# Patient Record
Sex: Male | Born: 1980 | Race: White | Hispanic: No | Marital: Single | Smoking: Former smoker
Health system: Southern US, Community
[De-identification: ages and names within clinical notes are randomized; demographics above are authoritative.]

## PROBLEM LIST (undated history)

## (undated) DIAGNOSIS — E079 Disorder of thyroid, unspecified: Secondary | ICD-10-CM

## (undated) DIAGNOSIS — F32A Depression, unspecified: Secondary | ICD-10-CM

## (undated) DIAGNOSIS — F419 Anxiety disorder, unspecified: Secondary | ICD-10-CM

## (undated) DIAGNOSIS — F329 Major depressive disorder, single episode, unspecified: Secondary | ICD-10-CM

## (undated) DIAGNOSIS — R4586 Emotional lability: Secondary | ICD-10-CM

## (undated) DIAGNOSIS — K219 Gastro-esophageal reflux disease without esophagitis: Secondary | ICD-10-CM

## (undated) DIAGNOSIS — J45909 Unspecified asthma, uncomplicated: Secondary | ICD-10-CM

## (undated) DIAGNOSIS — F4310 Post-traumatic stress disorder, unspecified: Secondary | ICD-10-CM

## (undated) DIAGNOSIS — I2699 Other pulmonary embolism without acute cor pulmonale: Secondary | ICD-10-CM

## (undated) DIAGNOSIS — I10 Essential (primary) hypertension: Secondary | ICD-10-CM

## (undated) DIAGNOSIS — Z8739 Personal history of other diseases of the musculoskeletal system and connective tissue: Secondary | ICD-10-CM

## (undated) DIAGNOSIS — Z8719 Personal history of other diseases of the digestive system: Secondary | ICD-10-CM

## (undated) HISTORY — DX: Essential (primary) hypertension: I10

## (undated) HISTORY — PX: ESOPHAGOSCOPY WITH DILITATION: SHX5618

## (undated) HISTORY — DX: Personal history of other diseases of the digestive system: Z87.19

## (undated) HISTORY — DX: Emotional lability: R45.86

## (undated) HISTORY — PX: CERVICAL FUSION: SHX112

## (undated) HISTORY — DX: Personal history of other diseases of the musculoskeletal system and connective tissue: Z87.39

## (undated) HISTORY — PX: ANKLE SURGERY: SHX546

## (undated) HISTORY — DX: Post-traumatic stress disorder, unspecified: F43.10

## (undated) HISTORY — DX: Gastro-esophageal reflux disease without esophagitis: K21.9

## (undated) HISTORY — PX: ADENOIDECTOMY: SUR15

## (undated) HISTORY — PX: TONSILLECTOMY: SUR1361

## (undated) HISTORY — PX: HERNIA REPAIR: SHX51

---

## 2013-09-23 HISTORY — PX: ROTATOR CUFF REPAIR: SHX139

## 2018-02-21 DIAGNOSIS — I2699 Other pulmonary embolism without acute cor pulmonale: Secondary | ICD-10-CM

## 2018-02-21 HISTORY — DX: Other pulmonary embolism without acute cor pulmonale: I26.99

## 2018-06-08 ENCOUNTER — Emergency Department (HOSPITAL_COMMUNITY)
Admission: EM | Admit: 2018-06-08 | Discharge: 2018-06-09 | Disposition: A | Discharge status: Home / Self Care | Payer: Self-pay | Attending: Emergency Medicine | Admitting: Emergency Medicine

## 2018-06-08 ENCOUNTER — Encounter (HOSPITAL_COMMUNITY): Payer: Self-pay | Admitting: Emergency Medicine

## 2018-06-08 ENCOUNTER — Other Ambulatory Visit: Payer: Self-pay

## 2018-06-08 DIAGNOSIS — Z86711 Personal history of pulmonary embolism: Secondary | ICD-10-CM | POA: Insufficient documentation

## 2018-06-08 DIAGNOSIS — J45909 Unspecified asthma, uncomplicated: Secondary | ICD-10-CM | POA: Insufficient documentation

## 2018-06-08 DIAGNOSIS — Z87891 Personal history of nicotine dependence: Secondary | ICD-10-CM | POA: Insufficient documentation

## 2018-06-08 DIAGNOSIS — E039 Hypothyroidism, unspecified: Secondary | ICD-10-CM | POA: Insufficient documentation

## 2018-06-08 DIAGNOSIS — M5442 Lumbago with sciatica, left side: Secondary | ICD-10-CM | POA: Insufficient documentation

## 2018-06-08 DIAGNOSIS — G8929 Other chronic pain: Secondary | ICD-10-CM | POA: Insufficient documentation

## 2018-06-08 HISTORY — DX: Disorder of thyroid, unspecified: E07.9

## 2018-06-08 HISTORY — DX: Anxiety disorder, unspecified: F41.9

## 2018-06-08 HISTORY — DX: Other pulmonary embolism without acute cor pulmonale: I26.99

## 2018-06-08 HISTORY — DX: Depression, unspecified: F32.A

## 2018-06-08 HISTORY — DX: Major depressive disorder, single episode, unspecified: F32.9

## 2018-06-08 HISTORY — DX: Unspecified asthma, uncomplicated: J45.909

## 2018-06-08 MED ORDER — ONDANSETRON 8 MG PO TBDP
8.0000 mg | ORAL_TABLET | Freq: Once | ORAL | Status: AC
  Administered 2018-06-08: 8 mg via ORAL
  Filled 2018-06-08: qty 1

## 2018-06-08 MED ORDER — HYDROMORPHONE HCL 1 MG/ML IJ SOLN
1.0000 mg | Freq: Once | INTRAMUSCULAR | Status: AC
  Administered 2018-06-08: 1 mg via INTRAMUSCULAR
  Filled 2018-06-08: qty 1

## 2018-06-08 NOTE — ED Triage Notes (Signed)
Pt brought in by [facility] for c/o back pain that has been ongoing for past couple of days.

## 2018-06-08 NOTE — ED Provider Notes (Signed)
[facility] EMERGENCY DEPARTMENT Provider Note   CSN: [id number] Arrival date & time: 06/08/18  2146     History   Chief Complaint Chief Complaint  Patient presents with  . Back Pain    HPI Lee Jackson is a 37 y.o. male.  HPI  Lee Jackson is a 37 y.o. male with hx of chronic low back pain,  presents to the Emergency Department complaining of increasing pain to his lower back for 2-3 days.  Pain became worse after bending over to pick up something.  He describes the pain as constant to his midline lower back and radiates into the left thigh to level of his knee. He lives in [place] and currently here visiting.  He states he had MRI last December in [place], [place] that indicates he has a "bulging disc" he denies fever, chills, fall, numbness or weakness of the extremities, abd pain, urine or bowel changes.  Has been taking ibuprofen, but cannot no longer take it due to bleeding and he currently taking Eliquis due to previous PE.       Past Medical History:  Diagnosis Date  . Anxiety   . Asthma   . Depression   . Pulmonary embolism (HCC)   . Thyroid disease     There are no active problems to display for this patient.   Past Surgical History:  Procedure Laterality Date  . ANKLE SURGERY    . CERVICAL FUSION    . HERNIA REPAIR    . TONSILLECTOMY          Home Medications    Prior to Admission medications   Not on File    Family History No family history on file.  Social History Social History   Tobacco Use  . Smoking status: Former Games developer  . Smokeless tobacco: Never Used  Substance Use Topics  . Alcohol use: Not Currently  . Drug use: Not Currently     Allergies   Pickled meat; Latex; Mucinex [guaifenesin er]; Penicillins; Prevacid [lansoprazole]; Sudafed [pseudoephedrine hcl]; and Vancomycin   Review of Systems Review of Systems  Constitutional: Negative for fever.  Respiratory: Negative for shortness of breath.   Gastrointestinal: Negative for  abdominal pain, constipation and vomiting.  Genitourinary: Negative for decreased urine volume, difficulty urinating, dysuria, flank pain and hematuria.  Musculoskeletal: Positive for back pain. Negative for joint swelling.  Skin: Negative for rash.  Neurological: Negative for weakness and numbness.  All other systems reviewed and are negative.    Physical Exam Updated Vital Signs BP (!) 138/93 (BP Location: Right Arm)   Pulse 99   Temp 97.7 F (36.5 C) (Oral)   Resp 20   Ht 5\' 10"  (1.778 m)   Wt 113.4 kg   SpO2 96%   BMI 35.87 kg/m   Physical Exam  Constitutional: He is oriented to person, place, and time. He appears well-developed and well-nourished. No distress.  HENT:  Head: Normocephalic and atraumatic.  Neck: Normal range of motion. Neck supple.  Cardiovascular: Normal rate, regular rhythm and intact distal pulses.  DP pulses are strong and palpable bilaterally  Pulmonary/Chest: Effort normal and breath sounds normal. No respiratory distress.  Abdominal: Soft. He exhibits no distension. There is no tenderness.  Musculoskeletal: He exhibits tenderness. He exhibits no edema.       Lumbar back: He exhibits tenderness and pain. He exhibits normal range of motion, no swelling, no deformity, no laceration and normal pulse.  ttp of the lower lumbar spine and bilateral paraspinal  muscles, left greater than right.  Pt has 5/5 strength against resistance of bilateral lower extremities.  Hip flexors intact   Neurological: He is alert and oriented to person, place, and time. He has normal strength. No sensory deficit. He exhibits normal muscle tone. Coordination and gait normal.  Reflex Scores:      Patellar reflexes are 2+ on the right side and 2+ on the left side.      Achilles reflexes are 2+ on the right side and 2+ on the left side. Skin: Skin is warm and dry. Capillary refill takes less than 2 seconds. No rash noted.  Nursing note and vitals reviewed.    ED Treatments /  Results  Labs (all labs ordered are listed, but only abnormal results are displayed) Labs Reviewed - No data to display  EKG None  Radiology No results found.  Procedures Procedures (including critical care time)  Medications Ordered in ED Medications - No data to display   Initial Impression / Assessment and Plan / ED Course  I have reviewed the triage vital signs and the nursing notes.  Pertinent labs & imaging results that were available during my care of the patient were reviewed by me and considered in my medical decision making (see chart for details).     Review of pt's medical record shows MRI L spine from 12/18:   L4-5 moderate broad-based disc osteophyte eccentric left with small superimposed left paracentral disc extrusion extending inferiorly along the left posterior aspect of the L5 vertebral body results in mild left greater than right lateral recess narrowing with possible impingement of the descending left L5 nerve root. Mild bilateral neuroforaminal narrowing at L4-5.  Pt reviewed on narcotic database.  No recent rx's on file  Pt with likely acute on chronic back pain.  No concerning sx's for emergent neurological process. Pt agrees to arrange close f/u with MD when he returns home.  Return precautions discussed.     Final Clinical Impressions(s) / ED Diagnoses   Final diagnoses:  Chronic left-sided low back pain with left-sided sciatica    ED Discharge Orders    None       Pauline Ausriplett, Lorielle Boehning, PA-C 06/09/18 2234    Maia PlanLong, Joshua G, MD 06/10/18 [id number]

## 2018-06-09 MED ORDER — HYDROCODONE-ACETAMINOPHEN 5-325 MG PO TABS: ORAL_TABLET | ORAL | 0 refills | Status: DC

## 2018-06-09 MED ORDER — CYCLOBENZAPRINE HCL 10 MG PO TABS: 10.0000 mg | ORAL_TABLET | Freq: Three times a day (TID) | ORAL | 0 refills | Status: DC | PRN

## 2018-06-09 NOTE — Discharge Instructions (Addendum)
Apply ice packs on and off to your back.  Avoid twisting or bending movements.  Follow-up with your doctor when you return home.

## 2018-08-18 ENCOUNTER — Emergency Department (HOSPITAL_COMMUNITY): Payer: Self-pay

## 2018-08-18 ENCOUNTER — Emergency Department (HOSPITAL_COMMUNITY)
Admission: EM | Admit: 2018-08-18 | Discharge: 2018-08-18 | Disposition: A | Discharge status: Home / Self Care | Payer: Self-pay | Attending: Emergency Medicine | Admitting: Emergency Medicine

## 2018-08-18 ENCOUNTER — Encounter (HOSPITAL_COMMUNITY): Payer: Self-pay | Admitting: *Deleted

## 2018-08-18 ENCOUNTER — Other Ambulatory Visit: Payer: Self-pay

## 2018-08-18 DIAGNOSIS — J40 Bronchitis, not specified as acute or chronic: Secondary | ICD-10-CM | POA: Insufficient documentation

## 2018-08-18 DIAGNOSIS — J4541 Moderate persistent asthma with (acute) exacerbation: Secondary | ICD-10-CM | POA: Insufficient documentation

## 2018-08-18 DIAGNOSIS — Z87891 Personal history of nicotine dependence: Secondary | ICD-10-CM | POA: Insufficient documentation

## 2018-08-18 DIAGNOSIS — Z7901 Long term (current) use of anticoagulants: Secondary | ICD-10-CM | POA: Insufficient documentation

## 2018-08-18 DIAGNOSIS — J069 Acute upper respiratory infection, unspecified: Secondary | ICD-10-CM | POA: Insufficient documentation

## 2018-08-18 DIAGNOSIS — Z79899 Other long term (current) drug therapy: Secondary | ICD-10-CM | POA: Insufficient documentation

## 2018-08-18 LAB — CBC WITH DIFFERENTIAL/PLATELET
ABS IMMATURE GRANULOCYTES: 0.03 10*3/uL (ref 0.00–0.07)
BASOS ABS: 0.1 10*3/uL (ref 0.0–0.1)
Basophils Relative: 1 %
EOS ABS: 0.6 10*3/uL — AB (ref 0.0–0.5)
Eosinophils Relative: 5 %
HEMATOCRIT: 49.9 % (ref 39.0–52.0)
Hemoglobin: 15.7 g/dL (ref 13.0–17.0)
IMMATURE GRANULOCYTES: 0 %
LYMPHS ABS: 2.8 10*3/uL (ref 0.7–4.0)
Lymphocytes Relative: 27 %
MCH: 26.7 pg (ref 26.0–34.0)
MCHC: 31.5 g/dL (ref 30.0–36.0)
MCV: 85 fL (ref 80.0–100.0)
MONOS PCT: 6 %
Monocytes Absolute: 0.6 10*3/uL (ref 0.1–1.0)
NRBC: 0 % (ref 0.0–0.2)
Neutro Abs: 6.3 10*3/uL (ref 1.7–7.7)
Neutrophils Relative %: 61 %
PLATELETS: 223 10*3/uL (ref 150–400)
RBC: 5.87 MIL/uL — ABNORMAL HIGH (ref 4.22–5.81)
RDW: 13 % (ref 11.5–15.5)
WBC: 10.4 10*3/uL (ref 4.0–10.5)

## 2018-08-18 LAB — BASIC METABOLIC PANEL
ANION GAP: 9 (ref 5–15)
BUN: 12 mg/dL (ref 6–20)
CALCIUM: 9.1 mg/dL (ref 8.9–10.3)
CO2: 25 mmol/L (ref 22–32)
Chloride: 105 mmol/L (ref 98–111)
Creatinine, Ser: 1.1 mg/dL (ref 0.61–1.24)
GFR calc Af Amer: >60 mL/min (ref >60)
GFR calc non Af Amer: >60 mL/min (ref >60)
Glucose, Bld: 114 mg/dL — ABNORMAL HIGH (ref 70–99)
POTASSIUM: 3.8 mmol/L (ref 3.5–5.1)
Sodium: 139 mmol/L (ref 135–145)

## 2018-08-18 MED ORDER — ALBUTEROL (5 MG/ML) CONTINUOUS INHALATION SOLN
10.0000 mg/h | INHALATION_SOLUTION | Freq: Once | RESPIRATORY_TRACT | Status: AC
  Administered 2018-08-18: 10 mg/h via RESPIRATORY_TRACT
  Filled 2018-08-18: qty 20

## 2018-08-18 MED ORDER — IPRATROPIUM BROMIDE 0.02 % IN SOLN
0.5000 mg | Freq: Once | RESPIRATORY_TRACT | Status: AC
  Administered 2018-08-18: 0.5 mg via RESPIRATORY_TRACT
  Filled 2018-08-18: qty 2.5

## 2018-08-18 MED ORDER — BENZONATATE 100 MG PO CAPS: 200.0000 mg | ORAL_CAPSULE | Freq: Two times a day (BID) | ORAL | 0 refills | Status: DC | PRN

## 2018-08-18 MED ORDER — ALBUTEROL SULFATE HFA 108 (90 BASE) MCG/ACT IN AERS: 1.0000 | INHALATION_SPRAY | RESPIRATORY_TRACT | 0 refills | Status: DC | PRN

## 2018-08-18 MED ORDER — PREDNISONE 10 MG PO TABS: 50.0000 mg | ORAL_TABLET | Freq: Every day | ORAL | 0 refills | Status: DC

## 2018-08-18 MED ORDER — PREDNISONE 50 MG PO TABS
60.0000 mg | ORAL_TABLET | Freq: Once | ORAL | Status: AC
  Administered 2018-08-18: 60 mg via ORAL
  Filled 2018-08-18: qty 1

## 2018-08-18 MED ORDER — BENZONATATE 100 MG PO CAPS
200.0000 mg | ORAL_CAPSULE | Freq: Once | ORAL | Status: AC
  Administered 2018-08-18: 200 mg via ORAL
  Filled 2018-08-18: qty 2

## 2018-08-18 NOTE — ED Triage Notes (Signed)
Pt brought in by [facility] for c/o coughing up green and white sputum; pt has been coughing x one month; pt c/o right side chest pain

## 2018-08-18 NOTE — Discharge Instructions (Signed)
We saw you in the ER for your breathing related complains. We gave you some breathing treatments in the ER, and seems like your symptoms have improved. °Please take albuterol as needed every 4 hours. °Please take the medications prescribed. °Please refrain from smoking or smoke exposure. °Please see a primary care doctor in 1 week. °Return to the ER if your symptoms worsen. ° °

## 2018-08-18 NOTE — ED Provider Notes (Signed)
[facility] EMERGENCY DEPARTMENT Provider Note   CSN: [id number] Arrival date & time: 08/18/18  4098     History   Chief Complaint Chief Complaint  Patient presents with  . Cough    HPI Lee Jackson is a 37 y.o. male.  HPI  37 year old male comes in with chief complaint of cough.  Patient has history of PE and pneumonia which were diagnosed about 6 months ago.  Patient is on Eliquis for his PE.  Patient states that over the past 2 or 3 weeks he has had cough and URI-like symptoms.  Today with his cough he produced thick, dark phlegm.  Patient denies any associated fevers, chills.  Patient does have mild shortness of breath and wheezing with his cough.  He has history of asthma.  Patient denies any chest pain.  Past Medical History:  Diagnosis Date  . Anxiety   . Asthma   . Depression   . Pulmonary embolism (HCC)   . Thyroid disease     There are no active problems to display for this patient.   Past Surgical History:  Procedure Laterality Date  . ANKLE SURGERY    . CERVICAL FUSION    . HERNIA REPAIR    . TONSILLECTOMY          Home Medications    Prior to Admission medications   Medication Sig Start Date End Date Taking? Authorizing Provider  amitriptyline (ELAVIL) 75 MG tablet Take 75 mg by mouth at bedtime.    [provider]  apixaban (ELIQUIS) 5 MG TABS tablet Take 5 mg by mouth 2 (two) times daily.    [provider]  cyclobenzaprine (FLEXERIL) 10 MG tablet Take 1 tablet (10 mg total) by mouth 3 (three) times daily as needed. 06/09/18   Triplett, Tammy, PA-C  DULoxetine (CYMBALTA) 30 MG capsule Take 30 mg by mouth 2 (two) times daily.    [provider]  fluticasone (FLOVENT HFA) 44 MCG/ACT inhaler Inhale 2 puffs into the lungs daily as needed (for shortness of breath).     [provider]  HYDROcodone-acetaminophen (NORCO/VICODIN) 5-325 MG tablet Take one tab po q 4 hrs prn pain 06/09/18   Triplett, Tammy, PA-C    levothyroxine (SYNTHROID, LEVOTHROID) 150 MCG tablet Take 150 mcg by mouth daily before breakfast.    [provider]    Family History History reviewed. No pertinent family history.  Social History Social History   Tobacco Use  . Smoking status: Former Games developer  . Smokeless tobacco: Never Used  Substance Use Topics  . Alcohol use: Not Currently  . Drug use: Not Currently     Allergies   Pickled meat; Latex; Mucinex [guaifenesin er]; Penicillins; Prevacid [lansoprazole]; Sudafed [pseudoephedrine hcl]; and Vancomycin   Review of Systems Review of Systems  Constitutional: Positive for activity change.  Respiratory: Positive for cough and shortness of breath.   Allergic/Immunologic: Negative for immunocompromised state.  Hematological: Bruises/bleeds easily.  All other systems reviewed and are negative.    Physical Exam Updated Vital Signs BP 140/86   Pulse (!) 102   Temp 98.4 F (36.9 C) (Oral)   Resp 20   Ht 5\' 11"  (1.803 m)   Wt 111.1 kg   SpO2 100%   BMI 34.17 kg/m   Physical Exam  Constitutional: He is oriented to person, place, and time. He appears well-developed.  HENT:  Head: Atraumatic.  Neck: Neck supple.  Cardiovascular: Normal rate.  Pulmonary/Chest: Effort normal. He has wheezes.  Musculoskeletal:  He exhibits no edema or tenderness.  Neurological: He is alert and oriented to person, place, and time.  Skin: Skin is warm.  Nursing note and vitals reviewed.    ED Treatments / Results  Labs (all labs ordered are listed, but only abnormal results are displayed) Labs Reviewed  CBC WITH DIFFERENTIAL/PLATELET - Abnormal; Notable for the following components:      Result Value   RBC 5.87 (*)    Eosinophils Absolute 0.6 (*)    All other components within normal limits  BASIC METABOLIC PANEL    EKG None  Radiology Dg Chest 2 View  Result Date: 08/18/2018 CLINICAL DATA:  Cough EXAM: CHEST - 2 VIEW COMPARISON:  None. FINDINGS: The  heart size and mediastinal contours are within normal limits. Both lungs are clear. The visualized skeletal structures are unremarkable. IMPRESSION: No active cardiopulmonary disease. Electronically Signed   By: Deatra RobinsonKevin  Herman M.D.   On: 08/18/2018 04:52    Procedures Procedures (including critical care time)  Medications Ordered in ED Medications  benzonatate (TESSALON) capsule 200 mg (200 mg Oral Given 08/18/18 0552)  albuterol (PROVENTIL,VENTOLIN) solution continuous neb (10 mg/hr Nebulization Given 08/18/18 0555)  ipratropium (ATROVENT) nebulizer solution 0.5 mg (0.5 mg Nebulization Given 08/18/18 0555)     Initial Impression / Assessment and Plan / ED Course  I have reviewed the triage vital signs and the nursing notes.  Pertinent labs & imaging results that were available during my care of the patient were reviewed by me and considered in my medical decision making (see chart for details).  Clinical Course as of Aug 18 704  Tue Aug 18, 2018  0703 White count appears to be fairly normal.  Patient is getting nebulizer treatment and he was reassessed.  His wheezing has improved significantly.  He still having mild end expiratory wheezing.  I discussed with the patient that I think most likely he is having bronchitis with asthma exacerbation.  I also discussed with him that the blood-tinged cough is because of his bronchitis, and that he needs to continue taking the Eliquis. Plan is to discharge him with prednisone.  He will follow-up with his PCP next week.  WBC: 10.4 [AN]    Clinical Course User Index [AN] Derwood KaplanNanavati, Reizel Calzada, MD    [age] year old male comes in with chief complaint of cough.  He has history of PE for which he is taking Eliquis.  Patient has been having some URI-like symptoms for the past few days,, however last night he started having cough with what appears to be blood-tinged, thick and green phlegm.  He is not having frank hemoptysis, and he is not spiting out frank  blood either.  I do not think he is having large pulmonary hemorrhage, and we will not advise him to discontinue Eliquis at this time.  On exam he has generalized wheezing, therefore I think there is underlying asthma exacerbation versus bronchitis.  Chest x-ray does not show any evidence of pneumonia, clinical suspicion for pneumonia is also low given the persistence of symptoms for the last several days and no fevers.  Plan is to give him nebulizer treatment and reassess.  Final Clinical Impressions(s) / ED Diagnoses   Final diagnoses:  Bronchitis  Acute upper respiratory infection  Moderate persistent asthma with acute exacerbation    ED Discharge Orders    None       Derwood KaplanNanavati, Lavaeh Bau, MD 08/18/18 [phone]

## 2018-10-22 ENCOUNTER — Encounter (HOSPITAL_COMMUNITY): Payer: Self-pay | Admitting: Emergency Medicine

## 2018-10-22 ENCOUNTER — Emergency Department (HOSPITAL_COMMUNITY): Payer: Self-pay

## 2018-10-22 ENCOUNTER — Emergency Department (HOSPITAL_COMMUNITY)
Admission: EM | Admit: 2018-10-22 | Discharge: 2018-10-22 | Disposition: A | Discharge status: Home / Self Care | Payer: Self-pay | Attending: Emergency Medicine | Admitting: Emergency Medicine

## 2018-10-22 ENCOUNTER — Other Ambulatory Visit: Payer: Self-pay

## 2018-10-22 DIAGNOSIS — L97909 Non-pressure chronic ulcer of unspecified part of unspecified lower leg with unspecified severity: Secondary | ICD-10-CM | POA: Insufficient documentation

## 2018-10-22 DIAGNOSIS — Z79899 Other long term (current) drug therapy: Secondary | ICD-10-CM | POA: Insufficient documentation

## 2018-10-22 DIAGNOSIS — Z91040 Latex allergy status: Secondary | ICD-10-CM | POA: Insufficient documentation

## 2018-10-22 DIAGNOSIS — J45909 Unspecified asthma, uncomplicated: Secondary | ICD-10-CM | POA: Insufficient documentation

## 2018-10-22 DIAGNOSIS — E039 Hypothyroidism, unspecified: Secondary | ICD-10-CM | POA: Insufficient documentation

## 2018-10-22 DIAGNOSIS — Z7901 Long term (current) use of anticoagulants: Secondary | ICD-10-CM | POA: Insufficient documentation

## 2018-10-22 DIAGNOSIS — L03115 Cellulitis of right lower limb: Secondary | ICD-10-CM | POA: Insufficient documentation

## 2018-10-22 LAB — COMPREHENSIVE METABOLIC PANEL
ALT: 21 U/L (ref 0–44)
AST: 14 U/L — AB (ref 15–41)
Albumin: 4.1 g/dL (ref 3.5–5.0)
Alkaline Phosphatase: 48 U/L (ref 38–126)
Anion gap: 7 (ref 5–15)
BUN: 12 mg/dL (ref 6–20)
CHLORIDE: 105 mmol/L (ref 98–111)
CO2: 26 mmol/L (ref 22–32)
CREATININE: 1.14 mg/dL (ref 0.61–1.24)
Calcium: 8.7 mg/dL — ABNORMAL LOW (ref 8.9–10.3)
GFR calc Af Amer: >60 mL/min (ref >60)
GFR calc non Af Amer: >60 mL/min (ref >60)
Glucose, Bld: 96 mg/dL (ref 70–99)
Potassium: 4.1 mmol/L (ref 3.5–5.1)
SODIUM: 138 mmol/L (ref 135–145)
Total Bilirubin: 0.4 mg/dL (ref 0.3–1.2)
Total Protein: 7.1 g/dL (ref 6.5–8.1)

## 2018-10-22 LAB — CBC WITH DIFFERENTIAL/PLATELET
Abs Immature Granulocytes: 0.04 10*3/uL (ref 0.00–0.07)
Basophils Absolute: 0.1 10*3/uL (ref 0.0–0.1)
Basophils Relative: 1 %
EOS PCT: 4 %
Eosinophils Absolute: 0.4 10*3/uL (ref 0.0–0.5)
HEMATOCRIT: 49.5 % (ref 39.0–52.0)
HEMOGLOBIN: 15.5 g/dL (ref 13.0–17.0)
Immature Granulocytes: 1 %
LYMPHS ABS: 2.5 10*3/uL (ref 0.7–4.0)
LYMPHS PCT: 29 %
MCH: 26.7 pg (ref 26.0–34.0)
MCHC: 31.3 g/dL (ref 30.0–36.0)
MCV: 85.3 fL (ref 80.0–100.0)
MONO ABS: 0.6 10*3/uL (ref 0.1–1.0)
Monocytes Relative: 7 %
Neutro Abs: 5.2 10*3/uL (ref 1.7–7.7)
Neutrophils Relative %: 58 %
Platelets: 245 10*3/uL (ref 150–400)
RBC: 5.8 MIL/uL (ref 4.22–5.81)
RDW: 12.7 % (ref 11.5–15.5)
WBC: 8.9 10*3/uL (ref 4.0–10.5)
nRBC: 0 % (ref 0.0–0.2)

## 2018-10-22 LAB — TSH: TSH: 13.333 u[IU]/mL — ABNORMAL HIGH (ref 0.350–4.500)

## 2018-10-22 LAB — T4, FREE: Free T4: 0.71 ng/dL — ABNORMAL LOW (ref 0.82–1.77)

## 2018-10-22 MED ORDER — DOXYCYCLINE HYCLATE 100 MG PO CAPS: 100.0000 mg | ORAL_CAPSULE | Freq: Two times a day (BID) | ORAL | 0 refills | Status: DC

## 2018-10-22 NOTE — ED Triage Notes (Signed)
Pt has recurrent leg ulcers,  Over the last month they have progressed. Pt has been admitted in the past, testing done and no real answers to the condition or treatment.

## 2018-10-22 NOTE — Discharge Instructions (Signed)
Follow-up with the primary care doctor to help manage your thyroid and further manage the ulcer/infection.

## 2018-10-22 NOTE — ED Provider Notes (Signed)
[facility] EMERGENCY DEPARTMENT Provider Note   CSN: [id number] Arrival date & time: 10/22/18  1696     History   Chief Complaint Chief Complaint  Patient presents with  . leg ulcers    HPI Lee Jackson is a 38 y.o. male.  HPI Patient presents with leg ulcers.  Has had previous admissions for it.  Had been on both of his lower legs but now primarily on right side.  States he has had to have IV antibiotics after ulcer not work.  States he has had biopsies but only showed some inflammation.  Also has had previous pulmonary embolism.  Still on anticoagulation.  Also history of hypothyroidism.  No fevers.  Pain on the right side.  No drainage.  Has not had fevers or chills.  He has previously had issues with his thyroid.  Currently on 150 mcg.  Had been on that previously then dosage increased to 75 when his TSH improved.  He had to increase back to 150 around 3 months ago. Past Medical History:  Diagnosis Date  . Anxiety   . Asthma   . Depression   . Pulmonary embolism (HCC)   . Thyroid disease     There are no active problems to display for this patient.   Past Surgical History:  Procedure Laterality Date  . ANKLE SURGERY    . CERVICAL FUSION    . HERNIA REPAIR    . TONSILLECTOMY          Home Medications    Prior to Admission medications   Medication Sig Start Date End Date Taking? Authorizing Provider  albuterol (ACCUNEB) 0.63 MG/3ML nebulizer solution Take 1 ampule by nebulization every 6 (six) hours as needed for wheezing.   Yes [provider]  albuterol (PROVENTIL HFA;VENTOLIN HFA) 108 (90 Base) MCG/ACT inhaler Inhale 1-2 puffs into the lungs every 4 (four) hours as needed for wheezing or shortness of breath. 08/18/18  Yes [name], MD  amitriptyline (ELAVIL) 75 MG tablet Take 75 mg by mouth at bedtime.   Yes [provider]  apixaban (ELIQUIS) 5 MG TABS tablet Take 5 mg by mouth 2 (two) times daily.   Yes [provider]    cyclobenzaprine (FLEXERIL) 10 MG tablet Take 1 tablet (10 mg total) by mouth 3 (three) times daily as needed. 06/09/18  Yes Triplett, Tammy, PA-C  DULoxetine (CYMBALTA) 30 MG capsule Take 30 mg by mouth 2 (two) times daily.   Yes [provider]  levothyroxine (SYNTHROID, LEVOTHROID) 150 MCG tablet Take 150 mcg by mouth daily before breakfast.   Yes [provider]    Family History No family history on file.  Social History Social History   Tobacco Use  . Smoking status: Former Games developer  . Smokeless tobacco: Never Used  Substance Use Topics  . Alcohol use: Not Currently  . Drug use: Not Currently     Allergies   Banana; Pickled meat; Latex; Mucinex [guaifenesin er]; Penicillins; Prevacid [lansoprazole]; Sudafed [pseudoephedrine hcl]; and Vancomycin   Review of Systems Review of Systems  Constitutional: Negative for appetite change, fatigue and fever.  Respiratory: Negative for shortness of breath.   Cardiovascular: Negative for chest pain.  Gastrointestinal: Negative for abdominal pain.  Genitourinary: Negative for flank pain.  Musculoskeletal: Negative for back pain.  Skin: Positive for wound.  Neurological: Negative for weakness and light-headedness.  Hematological: Negative for adenopathy.  Psychiatric/Behavioral: Negative for confusion.     Physical Exam Updated Vital Signs BP (!) 141/87 (  BP Location: Left Arm)   Pulse 83   Temp 98 F (36.7 C) (Oral)   Resp 18   Ht 5\' 10"  (1.778 m)   Wt 111.1 kg   SpO2 97%   BMI 35.15 kg/m   Physical Exam Constitutional:      Appearance: Normal appearance.  HENT:     Head: Atraumatic.  Eyes:     Pupils: Pupils are equal, round, and reactive to light.  Neck:     Musculoskeletal: Neck supple.  Cardiovascular:     Rate and Rhythm: Regular rhythm.  Pulmonary:     Effort: Pulmonary effort is normal.     Breath sounds: No wheezing or rhonchi.  Abdominal:     Tenderness: There is no abdominal  tenderness.  Musculoskeletal:        General: Tenderness present.     Comments: Some mild edema bilateral lower extremities.  On right side has erythema with several different ulcers.  Some have some granulation tissue some are just flat based.  No drainage.  No purulence.  Pulse intact bilateral feet.  Skin:    General: Skin is warm.     Capillary Refill: Capillary refill takes less than 2 seconds.  Neurological:     General: No focal deficit present.     Mental Status: He is alert.      ED Treatments / Results  Labs (all labs ordered are listed, but only abnormal results are displayed) Labs Reviewed  TSH - Abnormal; Notable for the following components:      Result Value   TSH 13.333 (*)    All other components within normal limits  COMPREHENSIVE METABOLIC PANEL - Abnormal; Notable for the following components:   Calcium 8.7 (*)    AST 14 (*)    All other components within normal limits  CBC WITH DIFFERENTIAL/PLATELET  T4, FREE  T3, FREE    EKG None  Radiology [place] Venous Img Lower Unilateral Right  Result Date: 10/22/2018 CLINICAL DATA:  Right lower extremity edema and lower extremity ulcer. History of pulmonary embolism. EXAM: RIGHT LOWER EXTREMITY VENOUS DOPPLER ULTRASOUND TECHNIQUE: Gray-scale sonography with graded compression, as well as color Doppler and duplex ultrasound were performed to evaluate the lower extremity deep venous systems from the level of the common femoral vein and including the common femoral, femoral, profunda femoral, popliteal and calf veins including the posterior tibial, peroneal and gastrocnemius veins when visible. The superficial great saphenous vein was also interrogated. Spectral Doppler was utilized to evaluate flow at rest and with distal augmentation maneuvers in the common femoral, femoral and popliteal veins. COMPARISON:  None. FINDINGS: Contralateral Common Femoral Vein: Respiratory phasicity is normal and symmetric with the symptomatic  side. No evidence of thrombus. Normal compressibility. Common Femoral Vein: No evidence of thrombus. Normal compressibility, respiratory phasicity and response to augmentation. Saphenofemoral Junction: No evidence of thrombus. Normal compressibility and flow on color Doppler imaging. Profunda Femoral Vein: No evidence of thrombus. Normal compressibility and flow on color Doppler imaging. Femoral Vein: No evidence of thrombus. Normal compressibility, respiratory phasicity and response to augmentation. Popliteal Vein: No evidence of thrombus. Normal compressibility, respiratory phasicity and response to augmentation. Calf Veins: No evidence of thrombus. Normal compressibility and flow on color Doppler imaging. Superficial Great Saphenous Vein: No evidence of thrombus. Normal compressibility. Venous Reflux:  None. Other Findings: No evidence of superficial thrombophlebitis or abnormal fluid collection. IMPRESSION: No evidence of right lower extremity deep venous thrombosis. Electronically Signed   By: Irish LackGlenn  Yamagata  M.D.   On: 10/22/2018 10:50    Procedures Procedures (including critical care time)  Medications Ordered in ED Medications - No data to display   Initial Impression / Assessment and Plan / ED Course  I have reviewed the triage vital signs and the nursing notes.  Pertinent labs & imaging results that were available during my care of the patient were reviewed by me and considered in my medical decision making (see chart for details).     Patient with recurrent ulcers of lower extremity.  Scars and hyperpigmentation on left lower leg from previous wounds.  Will treat as infection now.  Negative Doppler.  Lab work reassuring but TSH is elevated.  T3 and T4 sent.  Will increase levothyroxine.  Will have follow-up with outpatient and care manager will help find PCP for patient, who does not have a  Final Clinical Impressions(s) / ED Diagnoses   Final diagnoses:  None    ED Discharge  Orders    None       Benjiman Core, MD 10/22/18 1346

## 2018-10-22 NOTE — Clinical Social Work Note (Signed)
CSW met with pt per Dr request to address issue of no insurance, no PCP.  Pt moved here towards the end of summer of 19 with girlfriend from [place], [place].  He has no income, has applied for disability, is currently awaiting news about the status of the appeal to his denial.  His financial support is his girlfriend, who gets disability as her only income. Offered him a referral to [facility] for PCP and nurse case management, for which he was grateful.  Given an appointment on Tuesday at 11:00.

## 2018-10-23 LAB — T3, FREE: T3 FREE: 3.7 pg/mL (ref 2.0–4.4)

## 2018-10-30 DIAGNOSIS — Z139 Encounter for screening, unspecified: Secondary | ICD-10-CM

## 2018-10-30 LAB — GLUCOSE, POCT (MANUAL RESULT ENTRY): POC Glucose: 100 mg/dl — AB (ref 70–99)
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
## 2018-11-09 ENCOUNTER — Ambulatory Visit: Payer: Medicaid Other | Admitting: Physician Assistant

## 2018-11-09 ENCOUNTER — Encounter: Payer: Self-pay | Admitting: Physician Assistant

## 2018-11-09 VITALS — BP 133/81 | HR 89 | Temp 98.1°F | Ht 70.0 in | Wt 260.0 lb

## 2018-11-09 DIAGNOSIS — F39 Unspecified mood [affective] disorder: Secondary | ICD-10-CM

## 2018-11-09 DIAGNOSIS — Z13220 Encounter for screening for lipoid disorders: Secondary | ICD-10-CM

## 2018-11-09 DIAGNOSIS — Z86711 Personal history of pulmonary embolism: Secondary | ICD-10-CM

## 2018-11-09 DIAGNOSIS — Z7901 Long term (current) use of anticoagulants: Secondary | ICD-10-CM

## 2018-11-09 DIAGNOSIS — E785 Hyperlipidemia, unspecified: Secondary | ICD-10-CM

## 2018-11-09 DIAGNOSIS — L97919 Non-pressure chronic ulcer of unspecified part of right lower leg with unspecified severity: Secondary | ICD-10-CM

## 2018-11-09 DIAGNOSIS — E039 Hypothyroidism, unspecified: Secondary | ICD-10-CM

## 2018-11-09 DIAGNOSIS — Z7689 Persons encountering health services in other specified circumstances: Secondary | ICD-10-CM

## 2018-11-09 DIAGNOSIS — Z131 Encounter for screening for diabetes mellitus: Secondary | ICD-10-CM

## 2018-11-09 MED ORDER — LEVOTHYROXINE SODIUM 150 MCG PO TABS: 150.0000 ug | ORAL_TABLET | Freq: Every day | ORAL | 1 refills | Status: AC

## 2018-11-09 NOTE — Patient Instructions (Signed)
Financial Counselor- [phone]  

## 2018-11-09 NOTE — Progress Notes (Signed)
BP 133/81 (BP Location: Left Arm, Patient Position: Sitting, Cuff Size: Normal)   Pulse 89   Temp 98.1 F (36.7 C)   Ht 5\' 10"  (1.778 m)   Wt 260 lb (117.9 kg)   SpO2 94%   BMI 37.31 kg/m    Subjective:    Patient ID: Lee Jackson, male    DOB: 15-Dec-1980, 38 y.o.   MRN: [id number]  HPI: Nehemias Knaff is a 38 y.o. male presenting on 11/09/2018 for New Patient (Initial Visit) (previous patient at [facility] in [place], [place]. pt states he recently monved here 08/2018) and Sore (bilateral lower legs for 2 years. pt states he has been hospitalized for it in the past. pt states ichy, burning, painful. R worse then L)   HPI   Chief Complaint  Patient presents with  . New Patient (Initial Visit)    previous patient at [facility] in [place], [place]. pt states he recently monved here 08/2018  . Sore    bilateral lower legs for 2 years. pt states he has been hospitalized for it in the past. pt states ichy, burning, painful. R worse then L    Pt previously seen at [facility] but was discharged due to chronic noncompliance (per Epic review)  Hx PE on anticoagulation (1st PE was in April 2019 after HCAP and LE cellulitis) , hypothyroidism, LE ulcers which have been biopsied and shown inflammation, chronic LBP,  s/p neck surgery,  adjustment disorder, anxiety disorder, depression, SI.    Pt reports that he was admitted to [facility] in [place] in the past- that is where he had biopsies done of his LE ulcers.   Pt says he was told the PEs were due to LE blood clot even though US showed none.    Records of this are not available for review at this time.   Pt states that LE ulcers have been present for 2 years.  He says they wax and wane but never resolve.  He denies picking at them.   Pt has not been to [facility] yet even though he was told about it.  He saw a psychiatrist here last week for disability evaluation.   He has significant hx mental disorders.   Relevant  past medical, surgical, family and social history reviewed and updated as indicated. Interim medical history since our last visit reviewed. Allergies and medications reviewed and updated.    Current Outpatient Medications:  .  albuterol (ACCUNEB) 0.63 MG/3ML nebulizer solution, Take 1 ampule by nebulization every 6 (six) hours as needed for wheezing., Disp: , Rfl:  .  albuterol (PROVENTIL HFA;VENTOLIN HFA) 108 (90 Base) MCG/ACT inhaler, Inhale 1-2 puffs into the lungs every 4 (four) hours as needed for wheezing or shortness of breath., Disp: 1 Inhaler, Rfl: 0 .  amitriptyline (ELAVIL) 75 MG tablet, Take 75 mg by mouth at bedtime., Disp: , Rfl:  .  apixaban (ELIQUIS) 5 MG TABS tablet, Take 5 mg by mouth 2 (two) times daily., Disp: , Rfl:  .  citalopram (CELEXA) 20 MG tablet, Take 20 mg by mouth daily., Disp: , Rfl:  .  cyclobenzaprine (FLEXERIL) 10 MG tablet, Take 1 tablet (10 mg total) by mouth 3 (three) times daily as needed., Disp: 21 tablet, Rfl: 0 .  DULoxetine (CYMBALTA) 30 MG capsule, Take 30 mg by mouth 2 (two) times daily., Disp: , Rfl:  .  ibuprofen (ADVIL,MOTRIN) 600 MG tablet, Take 600 mg by mouth every 6 (six) hours as  needed., Disp: , Rfl:  .  levothyroxine (SYNTHROID, LEVOTHROID) 125 MCG tablet, Take 125 mcg by mouth daily before breakfast., Disp: , Rfl:     Review of Systems  Constitutional: Positive for appetite change and fatigue. Negative for chills, diaphoresis, fever and unexpected weight change.  HENT: Negative for congestion, dental problem, drooling, ear pain, facial swelling, hearing loss, mouth sores, sneezing, sore throat, trouble swallowing and voice change.   Eyes: Positive for pain, itching and visual disturbance. Negative for discharge and redness.  Respiratory: Positive for cough, shortness of breath and wheezing. Negative for choking.   Cardiovascular: Positive for leg swelling. Negative for chest pain and palpitations.  Gastrointestinal: Negative for abdominal  pain, blood in stool, constipation, diarrhea and vomiting.  Endocrine: Negative for cold intolerance, heat intolerance and polydipsia.  Genitourinary: Negative for decreased urine volume, dysuria and hematuria.  Musculoskeletal: Positive for arthralgias, back pain and gait problem.  Skin: Positive for rash.  Allergic/Immunologic: Negative for environmental allergies.  Neurological: Positive for headaches. Negative for seizures, syncope and light-headedness.  Hematological: Negative for adenopathy.  Psychiatric/Behavioral: Positive for dysphoric mood. Negative for agitation and suicidal ideas. The patient is nervous/anxious.     Per HPI unless specifically indicated above     Objective:    BP 133/81 (BP Location: Left Arm, Patient Position: Sitting, Cuff Size: Normal)   Pulse 89   Temp 98.1 F (36.7 C)   Ht 5\' 10"  (1.778 m)   Wt 260 lb (117.9 kg)   SpO2 94%   BMI 37.31 kg/m   Wt Readings from Last 3 Encounters:  11/09/18 260 lb (117.9 kg)  10/30/18 245 lb (111.1 kg)  10/22/18 245 lb (111.1 kg)    Physical Exam Vitals signs reviewed.  Constitutional:      Appearance: He is well-developed.  HENT:     Head: Normocephalic and atraumatic.     Mouth/Throat:     Pharynx: No oropharyngeal exudate.  Eyes:     Conjunctiva/sclera: Conjunctivae normal.     Pupils: Pupils are equal, round, and reactive to light.  Neck:     Musculoskeletal: Neck supple.     Thyroid: No thyromegaly.  Cardiovascular:     Rate and Rhythm: Normal rate and regular rhythm.  Pulmonary:     Effort: Pulmonary effort is normal.     Breath sounds: Normal breath sounds. No wheezing or rales.  Abdominal:     General: Bowel sounds are normal.     Palpations: Abdomen is soft. There is no mass.     Tenderness: There is no abdominal tenderness.  Musculoskeletal:     Comments: Wounds RLE as per picture.  No erythema or drainage to indicate acute infection.    Lymphadenopathy:     Cervical: No cervical  adenopathy.  Skin:    General: Skin is warm and dry.     Findings: No rash.  Neurological:     Mental Status: He is alert and oriented to person, place, and time.  Psychiatric:        Behavior: Behavior normal.        Thought Content: Thought content normal.             Assessment & Plan:   Encounter Diagnoses  Name Primary?  . Encounter to establish care Yes  . Hyperlipidemia, unspecified hyperlipidemia type   . Screening cholesterol level   . Screening for diabetes mellitus   . Mood disorder (HCC)   . Ulcer of right lower extremity, unspecified ulcer stage (  HCC)   . Anticoagulated   . History of pulmonary embolism   . Hypothyroidism, unspecified type      -Pt already approved for medassist  -will Increase levothyroxine  (he has been on 125mcg for a year)  -urged [facility]  -will continue eliquis.  Application for pt assistance completed.  Will address at future OV whether this will need to continue for 1 year or indefinitely  -Labs- lipid panel, a1c  -Refer to wound clinic for leg ulcers in light of duration > 2 years.  Pt counseled to avoid using a multitude of assorted products that he has accumulated over the last 2 years.  He is counseled to wash once/day while in the shower.  He is to avoid applying alcohol, peroxide,  sprays, ointments, creams.  If the wounds are developing scabs that are itching, he can apply some aquaphor.   Otherwise no topicals.   He may cover it at night with dry nonstick dressing since he is unable to sleep at night due to pain from sheets rubbing it.    -Pt says he turned in [facility] via mail.  - [facility]  -pt to follow up here 1 month for recheck.  RTO sooner prn worsening or new symptoms

## 2018-11-10 ENCOUNTER — Telehealth: Payer: Self-pay

## 2018-11-10 ENCOUNTER — Other Ambulatory Visit (HOSPITAL_COMMUNITY)
Admission: RE | Admit: 2018-11-10 | Discharge: 2018-11-10 | Disposition: A | Discharge status: Home / Self Care | Payer: Medicaid Other | Source: Ambulatory Visit | Attending: Physician Assistant | Admitting: Physician Assistant

## 2018-11-10 DIAGNOSIS — E785 Hyperlipidemia, unspecified: Secondary | ICD-10-CM

## 2018-11-10 DIAGNOSIS — Z131 Encounter for screening for diabetes mellitus: Secondary | ICD-10-CM | POA: Insufficient documentation

## 2018-11-10 LAB — LIPID PANEL
CHOL/HDL RATIO: 5.9 ratio
Cholesterol: 195 mg/dL (ref 0–200)
HDL: 33 mg/dL — AB (ref >40)
LDL Cholesterol: 127 mg/dL — ABNORMAL HIGH (ref 0–99)
TRIGLYCERIDES: 176 mg/dL — AB (ref <150)
VLDL: 35 mg/dL (ref 0–40)

## 2018-11-10 NOTE — Telephone Encounter (Signed)
Called to follow up with client for [facility] Connect Case management after his [facility] appointment [facility] to [facility]. Client states appointment went well and he was actually at the hospital getting his labs drawn that were ordered by the provider. Client states that provider plans on referring him to a wound care center. Encouraged client to check on financial assistance application with [facility]. Contact name and number given. Limited conversation due to client waiting to have labs drawn. Plan to follow up after client's next visit with [facility]. Client has contact information for [facility] Case manager for any questions or needs. Client is agreeable to plan.   Francee Nodal RN

## 2018-11-12 LAB — HEMOGLOBIN A1C
Hgb A1c MFr Bld: 5.4 % (ref 4.8–5.6)
Mean Plasma Glucose: 108 mg/dL

## 2018-11-25 ENCOUNTER — Encounter: Payer: Self-pay | Attending: Internal Medicine | Admitting: Internal Medicine

## 2018-11-25 DIAGNOSIS — I872 Venous insufficiency (chronic) (peripheral): Secondary | ICD-10-CM | POA: Insufficient documentation

## 2018-11-25 DIAGNOSIS — D6862 Lupus anticoagulant syndrome: Secondary | ICD-10-CM | POA: Insufficient documentation

## 2018-11-25 DIAGNOSIS — Z87891 Personal history of nicotine dependence: Secondary | ICD-10-CM | POA: Insufficient documentation

## 2018-11-25 DIAGNOSIS — Z880 Allergy status to penicillin: Secondary | ICD-10-CM | POA: Insufficient documentation

## 2018-11-25 DIAGNOSIS — E039 Hypothyroidism, unspecified: Secondary | ICD-10-CM | POA: Insufficient documentation

## 2018-11-25 DIAGNOSIS — Z881 Allergy status to other antibiotic agents status: Secondary | ICD-10-CM | POA: Insufficient documentation

## 2018-11-25 DIAGNOSIS — E11622 Type 2 diabetes mellitus with other skin ulcer: Secondary | ICD-10-CM | POA: Insufficient documentation

## 2018-11-25 DIAGNOSIS — Z86711 Personal history of pulmonary embolism: Secondary | ICD-10-CM | POA: Insufficient documentation

## 2018-11-25 DIAGNOSIS — L97811 Non-pressure chronic ulcer of other part of right lower leg limited to breakdown of skin: Secondary | ICD-10-CM | POA: Insufficient documentation

## 2018-11-25 DIAGNOSIS — Z981 Arthrodesis status: Secondary | ICD-10-CM | POA: Insufficient documentation

## 2018-11-25 DIAGNOSIS — J45909 Unspecified asthma, uncomplicated: Secondary | ICD-10-CM | POA: Insufficient documentation

## 2018-11-25 DIAGNOSIS — E1151 Type 2 diabetes mellitus with diabetic peripheral angiopathy without gangrene: Secondary | ICD-10-CM | POA: Insufficient documentation

## 2018-11-25 DIAGNOSIS — I10 Essential (primary) hypertension: Secondary | ICD-10-CM | POA: Insufficient documentation

## 2018-11-25 DIAGNOSIS — E785 Hyperlipidemia, unspecified: Secondary | ICD-10-CM | POA: Insufficient documentation

## 2018-11-25 DIAGNOSIS — F419 Anxiety disorder, unspecified: Secondary | ICD-10-CM | POA: Insufficient documentation

## 2018-11-29 NOTE — Progress Notes (Signed)
PHUOC, PELLICANO ([id number]) Visit Report for 11/25/2018 Allergy List Details Patient Name: Lee Jackson, Lee Jackson Date of Service: 11/25/2018 1:00 PM Medical Record Number: [id number] Patient Account Number: [id number] Date of Birth/Sex: March 23, 1981 (38 y.o. 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 M) Treating RN: Arnette Norris Primary Care Audiel Scheiber: Jacquelin Hawking Other Clinician: Referring Dorian Duval: Jacquelin Hawking Treating Ellese Julius/Extender: Altamese [place] in Treatment: 0 Visit Information Patient Arrived: Ambulatory Arrival Time: 13:00 Accompanied By: friend Transfer Assistance: None Patient Identification Verified: Yes Secondary Verification Process Completed: Yes Electronic Signature(s) Signed: 11/27/2018 3:21:09 PM By: Arnette Norris Entered By: Arnette Norris on 11/25/2018 13:02:56 Keown, Barbara Cower ([id number]) -------------------------------------------------------------------------------- Clinic Level of Care Assessment Details Patient Name: Lee Jackson Date of Service: 11/25/2018 1:00 PM Medical Record Number:  [id number] Patient Account Number: [id number] Date of Birth/Sex: 01-Jun-1981 (38 y.o. M) Treating RN: Huel Coventry Primary Care Alexyia Guarino: Jacquelin Hawking Other Clinician: Referring Neelah Mannings: Jacquelin Hawking Treating Lukisha Procida/Extender: Altamese [place] in Treatment: 0 Clinic Level of Care Assessment Items TOOL 1 Quantity Score []  - Use when EandM and Procedure is performed on INITIAL visit 0 ASSESSMENTS - Nursing Assessment / Reassessment X - General Physical Exam (combine w/ comprehensive assessment (listed just below) when 1 20 performed on new pt. evals) X- 1 25 Comprehensive Assessment (HX, ROS, Risk Assessments, Wounds Hx, etc.) ASSESSMENTS - Wound and Skin Assessment / Reassessment []  - Dermatologic / Skin Assessment (not related to wound area) 0 ASSESSMENTS - Ostomy and/or Continence Assessment and Care []  - Incontinence Assessment and Management 0 []  - 0 Ostomy Care Assessment and Management (repouching, etc.) PROCESS - Coordination of Care X - Simple Patient / Family Education for ongoing care 1 15 []  - 0 Complex (extensive) Patient / Family Education for ongoing care X- 1 10 Staff obtains Chiropractor, Records, Test Results / Process Orders []  - 0 Staff telephones HHA, Nursing Homes / Clarify orders / etc []  - 0 Routine Transfer to another Facility (non-emergent condition) []  - 0 Routine Hospital Admission (non-emergent condition) X- 1 15 New Admissions / Manufacturing engineer / Ordering NPWT, Apligraf, etc. []  - 0 Emergency Hospital Admission (emergent condition) PROCESS - Special Needs []  - Pediatric / Minor Patient Management 0 []  - 0 Isolation Patient Management []  - 0 Hearing / Language / Visual special needs []  - 0 Assessment of Community assistance (transportation, D/C planning, etc.) []  - 0 Additional assistance / Altered mentation []  - 0 Support Surface(s) Assessment (bed, cushion, seat, etc.) Belcastro, Loden ([id number]) INTERVENTIONS -  Miscellaneous []  - External ear exam 0 []  - 0 Patient Transfer (multiple staff / Nurse, adult / Similar devices) []  - 0 Simple Staple / Suture removal (25 or less) []  - 0 Complex Staple / Suture removal (26 or more) []  - 0 Hypo/Hyperglycemic Management (do not check if billed separately) X- 1 15 Ankle / Brachial Index (ABI) - do not check if billed separately Has the patient been seen at the  hospital within the last three years: Yes Total Score: 100 Level Of Care: New/Established - Level 3 Electronic Signature(s) Signed: 11/25/2018 5:29:19 PM By: Elliot Gurney, BSN, RN, CWS, Kim RN, BSN Entered By: Elliot Gurney, BSN, RN, CWS, Kim on 11/25/2018 13:59:32 Harbach, Barbara Cower ([id number]) -------------------------------------------------------------------------------- Compression Therapy Details Patient Name: Lee Jackson Date of Service: 11/25/2018 1:00 PM Medical Record Number: [id number] Patient Account Number: [id number] Date of Birth/Sex: September 26, 1980 (38 y.o. M) Treating RN: Huel Coventry Primary Care Etoy Mcdonnell: Jacquelin Hawking Other Clinician: Referring Tashiba Timoney: Jacquelin Hawking Treating Shailah Gibbins/Extender: Altamese [place] in Treatment: 0 Compression Therapy Performed for Wound Assessment: Wound #1 Right,Proximal,Anterior Lower Leg Performed By: Clinician Huel Coventry, RN Compression Type: Three Layer Pre Treatment ABI: 1.3 Post Procedure Diagnosis Same as Pre-procedure Electronic Signature(s) Signed: 11/25/2018 5:29:19 PM By: Elliot Gurney, BSN, RN, CWS, Kim RN, BSN Entered By: Elliot Gurney, BSN, RN, CWS, Kim on 11/25/2018 13:59:05 Lee Jackson ([id number]) -------------------------------------------------------------------------------- Encounter Discharge Information Details Patient Name: Lee Jackson Date of Service: 11/25/2018 1:00 PM Medical Record Number: [id number] Patient Account Number: [id number] Date of Birth/Sex: 01-01-1981 (38 y.o. M) Treating RN: Rodell Perna Primary Care Genie Mirabal: Jacquelin Hawking Other  Clinician: Referring Sheikh Leverich: Jacquelin Hawking Treating Kerigan Narvaez/Extender: Altamese [place] in Treatment: 0 Encounter Discharge Information Items Discharge Condition: Stable Ambulatory Status: Ambulatory Discharge Destination: Home Transportation: Private Auto Accompanied By: spouse Schedule Follow-up Appointment: Yes Clinical Summary of Care: Electronic Signature(s) Signed: 11/25/2018 4:40:46 PM By: Rodell Perna Entered By: Rodell Perna on 11/25/2018 14:16:23 Gorelik, Barbara Cower ([id number]) -------------------------------------------------------------------------------- Lower Extremity Assessment Details Patient Name: Lee Jackson Date of Service: 11/25/2018 1:00 PM Medical Record Number: [id number] Patient Account Number: [id number] Date of Birth/Sex: [birth year] (38 y.o. M) Treating RN: Arnette Norris Primary Care Zeeshan Korte: Jacquelin Hawking Other Clinician: Referring Bianco Cange: Jacquelin Hawking Treating Khrystal Jeanmarie/Extender: Altamese [place] in Treatment: 0 Edema Assessment Assessed: [Left: No] [Right: No] [Left: Edema] [Right: :] Calf Left: Right: Point of Measurement: 34 cm From Medial Instep 43.5 cm 43 cm Ankle Left: Right: Point of Measurement: 10 cm From Medial Instep 25.2 cm 25 cm Vascular Assessment Pulses: Dorsalis Pedis Palpable: [Left:Yes] [Right:Yes] Posterior Tibial Palpable: [Left:Yes] [Right:Yes] Extremity colors, hair growth, and conditions: Extremity Color: [Left:Hyperpigmented] [Right:Hyperpigmented] Hair Growth on Extremity: [Left:Yes] [Right:Yes] Temperature of Extremity: [Left:Warm] [Right:Warm] Capillary Refill: [Left:< 3 seconds] [Right:< 3 seconds] Blood Pressure: Brachial: [Left:128] Dorsalis Pedis: 152 [Left:Dorsalis Pedis: 162] Ankle: Posterior Tibial: 170 [Left:Posterior Tibial: 178 1.33] [Right:1.39] Toe Nail Assessment Left: Right: Thick: Yes Yes Discolored: Yes Yes Deformed: Yes Yes Improper Length and Hygiene: No No Electronic  Signature(s) Signed: 11/27/2018 3:21:09 PM By: Arnette Norris Entered By: Arnette Norris on 11/25/2018 13:25:37 Nickolson, Barbara Cower ([id number]) -------------------------------------------------------------------------------- Multi Wound Chart Details Patient Name: Lee Jackson Date of Service: 11/25/2018 1:00 PM Medical Record Number: [id number] Patient Account Number: [id number] Date of Birth/Sex: 06-15-1981 (38 y.o. M) Treating RN: Huel Coventry Primary Care Coltyn Hanning: Jacquelin Hawking Other Clinician: Referring Maryah Marinaro: Jacquelin Hawking Treating Verlean Allport/Extender: Altamese [place] in Treatment: 0 Vital Signs Height(in): 70 Pulse(bpm): 85 Weight(lbs): 250 Blood Pressure(mmHg): 145/99 Body Mass Index(BMI): 36 Temperature(F): 98.0 Respiratory Rate 18 (breaths/min): Photos: Wound Location: Right Lower Leg - Anterior, Right Lower Leg - Anterior Left Lower Leg - Anterior, Proximal Distal Wounding Event: Blister Blister Blister Primary Etiology: Venous Leg Ulcer Venous Leg Ulcer Venous Leg Ulcer Comorbid History: Asthma Asthma Asthma Date Acquired: 11/02/2018 11/02/2018 11/02/2018 Weeks of Treatment: 0 0 0 Wound Status: Open Open Open Measurements L x W x D 0.5x0.5x0.1 0.9x0.3x0.1 1.8x0.9x0.1 (cm) Area (cm) : 0.196 0.212 1.272  Volume (cm) : 0.02 0.021 0.127 Classification: Full Thickness Without Full Thickness Without Full Thickness Without Exposed Support Structures Exposed Support Structures Exposed Support Structures Exudate Amount: None Present None Present None Present Wound Margin: Flat and Intact Flat and Intact Flat and Intact Granulation Amount: Medium (34-66%) Medium (34-66%) Medium (34-66%) Granulation Quality: Red Red Red Necrotic Amount: Medium (34-66%) Medium (34-66%) Medium (34-66%) Exposed Structures: Fat Layer (Subcutaneous Fat Layer (Subcutaneous Fat Layer (Subcutaneous Tissue) Exposed: Yes Tissue) Exposed: Yes Tissue) Exposed: Yes Fascia: No Fascia:  No Fascia: No Tendon: No Tendon: No Tendon: No Muscle: No Muscle: No Muscle: No Joint: No Joint: No Joint: No Bone: No Bone: No Bone: No Epithelialization: Small (1-33%) Small (1-33%) None Periwound Skin Texture: Scarring: Yes Scarring: Yes Scarring: Yes Excoriation: No Excoriation: No Excoriation: No Stille, Lorry ([id number]) Induration: No Induration: No Induration: No Callus: No Callus: No Callus: No Crepitus: No Crepitus: No Crepitus: No Rash: No Rash: No Rash: No Periwound Skin Moisture: Dry/Scaly: Yes Dry/Scaly: Yes Dry/Scaly: Yes Maceration: No Maceration: No Maceration: No Periwound Skin Color: Hemosiderin Staining: Yes Hemosiderin Staining: Yes Hemosiderin Staining: Yes Atrophie Blanche: No Atrophie Blanche: No Atrophie Blanche: No Cyanosis: No Cyanosis: No Cyanosis: No Ecchymosis: No Ecchymosis: No Ecchymosis: No Erythema: No Erythema: No Erythema: No Mottled: No Mottled: No Mottled: No Pallor: No Pallor: No Pallor: No Rubor: No Rubor: No Rubor: No Temperature: No Abnormality No Abnormality No Abnormality Tenderness on Palpation: No No No Wound Preparation: Ulcer Cleansing: Ulcer Cleansing: Ulcer Cleansing: Rinsed/Irrigated with Saline Rinsed/Irrigated with Saline Rinsed/Irrigated with Saline Topical Anesthetic Applied: Topical Anesthetic Applied: Topical Anesthetic Applied: Other: lidocaine 4% Other: lidocaine 4% Other: lidocaine 4% Treatment Notes Electronic Signature(s) Signed: 11/25/2018 5:29:19 PM By: Elliot GurneyWoody, BSN, RN, CWS, Kim RN, BSN Entered By: Elliot GurneyWoody, BSN, RN, CWS, Kim on 11/25/2018 13:37:57 Rundquist, Barbara CowerJASON ([id number]) -------------------------------------------------------------------------------- Multi-Disciplinary Care Plan Details Patient Name: Lee CourierPENN, Saqib Date of Service: 11/25/2018 1:00 PM Medical Record Number: [id number] Patient Account Number: [id number] Date of Birth/Sex: [birth year] (38 y.o. M) Treating RN: Huel CoventryWoody,  Kim Primary Care Ximena Todaro: Jacquelin HawkingMCELROY, SHANNON Other Clinician: Referring Kenon Delashmit: Jacquelin HawkingMCELROY, SHANNON Treating Ariv Penrod/Extender: Altamese CarolinaOBSON, MICHAEL G Weeks in Treatment: 0 Active Inactive Necrotic Tissue Nursing Diagnoses: Impaired tissue integrity related to necrotic/devitalized tissue Goals: Necrotic/devitalized tissue will be minimized in the wound bed Date Initiated: 11/25/2018 Target Resolution Date: 12/25/2018 Goal Status: Active Interventions: Provide education on necrotic tissue and debridement process Notes: Orientation to the Wound Care Program Nursing Diagnoses: Knowledge deficit related to the wound healing center program Goals: Patient/caregiver will verbalize understanding of the Wound Healing Center Program Date Initiated: 11/25/2018 Target Resolution Date: 12/25/2018 Goal Status: Active Interventions: Provide education on orientation to the wound center Notes: Wound/Skin Impairment Nursing Diagnoses: Impaired tissue integrity Goals: Ulcer/skin breakdown will have a volume reduction of 30% by week 4 Date Initiated: 11/25/2018 Target Resolution Date: 12/25/2018 Goal Status: Active Interventions: Assess ulceration(s) every visit Lee CourierENN, Rydge ([id number]) Treatment Activities: Topical wound management initiated : 11/25/2018 Notes: Electronic Signature(s) Signed: 11/25/2018 5:29:19 PM By: Elliot GurneyWoody, BSN, RN, CWS, Kim RN, BSN Entered By: Elliot GurneyWoody, BSN, RN, CWS, Kim on 11/25/2018 13:37:11 Martinec, Barbara CowerJASON ([id number]) -------------------------------------------------------------------------------- Pain Assessment Details Patient Name: Lee CourierPENN, Hasson Date of Service: 11/25/2018 1:00 PM Medical Record Number: [id number] Patient Account Number: [id number] Date of Birth/Sex: [birth year] (38 y.o. M) Treating RN: Arnette NorrisBiell, Kristina Primary Care Bryonna Sundby: Jacquelin HawkingMCELROY, SHANNON Other Clinician: Referring Zeniyah Peaster: Jacquelin HawkingMCELROY, SHANNON Treating Labrenda Lasky/Extender: Altamese CarolinaOBSON, MICHAEL G Weeks in Treatment: 0 Active  Problems Location of Pain Severity and Description of Pain Patient Has Paino  Yes Site Locations Duration of the Pain. Constant / Intermittento Constant Rate the pain. Current Pain Level: 8 Character of Pain Describe the Pain: Burning Pain Management and Medication Current Pain Management: Electronic Signature(s) Signed: 11/27/2018 3:21:09 PM By: Arnette Norris Entered By: Arnette Norris on 11/25/2018 13:05:28 Moxey, Barbara Cower ([id number]) -------------------------------------------------------------------------------- Patient/Caregiver Education Details Patient Name: Lee Jackson Date of Service: 11/25/2018 1:00 PM Medical Record Number: [id number] Patient Account Number: [id number] Date of Birth/Gender: 08/17/1981 (38 y.o. M) Treating RN: Huel Coventry Primary Care Physician: Jacquelin Hawking Other Clinician: Referring Physician: Jacquelin Hawking Treating Physician/Extender: Altamese [place] in Treatment: 0 Education Assessment Education Provided To: Patient Education Topics Provided Wound Debridement: Handouts: Wound Debridement Methods: Demonstration, Explain/Verbal Responses: State content correctly Wound/Skin Impairment: Handouts: Caring for Your Ulcer Methods: Demonstration, Explain/Verbal Responses: State content correctly Electronic Signature(s) Signed: 11/25/2018 5:29:19 PM By: Elliot Gurney, BSN, RN, CWS, Kim RN, BSN Entered By: Elliot Gurney, BSN, RN, CWS, Kim on 11/25/2018 13:59:54 Dier, Barbara Cower ([id number]) -------------------------------------------------------------------------------- Wound Assessment Details Patient Name: Lee Jackson Date of Service: 11/25/2018 1:00 PM Medical Record Number: [id number] Patient Account Number: [id number] Date of Birth/Sex: 1981/03/20 (38 y.o. M) Treating RN: Curtis Sites Primary Care Nahara Dona: Jacquelin Hawking Other Clinician: Referring Gisell Buehrle: Jacquelin Hawking Treating Doron Shake/Extender: Altamese [place] in Treatment: 0 Wound  Status Wound Number: 1 Primary Etiology: Venous Leg Ulcer Wound Location: Right Lower Leg - Anterior, Proximal Wound Status: Open Wounding Event: Blister Comorbid History: Asthma Date Acquired: 11/02/2018 Weeks Of Treatment: 0 Clustered Wound: No Photos Photo Uploaded By: Curtis Sites on 11/25/2018 13:37:27 Wound Measurements Length: (cm) 0.5 Width: (cm) 0.5 Depth: (cm) 0.1 Area: (cm) 0.196 Volume: (cm) 0.02 % Reduction in Area: % Reduction in Volume: Epithelialization: Small (1-33%) Tunneling: No Undermining: No Wound Description Full Thickness Without Exposed Support Classification: Structures Wound Margin: Flat and Intact Exudate None Present Amount: Foul Odor After Cleansing: No Slough/Fibrino Yes Wound Bed Granulation Amount: Medium (34-66%) Exposed Structure Granulation Quality: Red Fascia Exposed: No Necrotic Amount: Medium (34-66%) Fat Layer (Subcutaneous Tissue) Exposed: Yes Necrotic Quality: Adherent Slough Tendon Exposed: No Muscle Exposed: No Joint Exposed: No Bone Exposed: No Periwound Skin Texture Texture Color Chovan, Melanie ([id number]) No Abnormalities Noted: No No Abnormalities Noted: No Callus: No Atrophie Blanche: No Crepitus: No Cyanosis: No Excoriation: No Ecchymosis: No Induration: No Erythema: No Rash: No Hemosiderin Staining: Yes Scarring: Yes Mottled: No Pallor: No Moisture Rubor: No No Abnormalities Noted: No Dry / Scaly: Yes Temperature / Pain Maceration: No Temperature: No Abnormality Wound Preparation Ulcer Cleansing: Rinsed/Irrigated with Saline Topical Anesthetic Applied: Other: lidocaine 4%, Treatment Notes Wound #1 (Right, Proximal, Anterior Lower Leg) Notes prisma, ABD, 3 layer Electronic Signature(s) Signed: 11/25/2018 1:27:53 PM By: Curtis Sites Entered By: Curtis Sites on 11/25/2018 13:27:53 Vandevoort, Colden  ([id number]) -------------------------------------------------------------------------------- Wound Assessment Details Patient Name: Lee Jackson Date of Service: 11/25/2018 1:00 PM Medical Record Number: [id number] Patient Account Number: [id number] Date of Birth/Sex: 11/15/1980 (38 y.o. M) Treating RN: Curtis Sites Primary Care Ilyas Lipsitz: Jacquelin Hawking Other Clinician: Referring Donal Lynam: Jacquelin Hawking Treating Rashauna Tep/Extender: Altamese  in Treatment: 0 Wound Status Wound Number: 2 Primary Etiology: Venous Leg Ulcer Wound Location: Right Lower Leg - Anterior Wound Status: Open Wounding Event: Blister Comorbid History: Asthma Date Acquired: 11/02/2018 Weeks Of Treatment: 0 Clustered Wound: No Photos Photo Uploaded By: Curtis Sites on 11/25/2018 13:37:28 Wound Measurements Length: (cm) 0.9 Width: (cm) 0.3 Depth: (cm) 0.1 Area: (cm) 0.212 Volume: (cm) 0.021 % Reduction in Area: % Reduction in Volume: Epithelialization: Small (1-33%) Tunneling:  No Undermining: No Wound Description Full Thickness Without Exposed Support Classification: Structures Wound Margin: Flat and Intact Exudate None Present Amount: Foul Odor After Cleansing: No Slough/Fibrino Yes Wound Bed Granulation Amount: Medium (34-66%) Exposed Structure Granulation Quality: Red Fascia Exposed: No Necrotic Amount: Medium (34-66%) Fat Layer (Subcutaneous Tissue) Exposed: Yes Necrotic Quality: Adherent Slough Tendon Exposed: No Muscle Exposed: No Joint Exposed: No Bone Exposed: No Periwound Skin Texture Texture Color Joo, Aramis ([id number]) No Abnormalities Noted: No No Abnormalities Noted: No Callus: No Atrophie Blanche: No Crepitus: No Cyanosis: No Excoriation: No Ecchymosis: No Induration: No Erythema: No Rash: No Hemosiderin Staining: Yes Scarring: Yes Mottled: No Pallor: No Moisture Rubor: No No Abnormalities Noted: No Dry / Scaly: Yes Temperature /  Pain Maceration: No Temperature: No Abnormality Wound Preparation Ulcer Cleansing: Rinsed/Irrigated with Saline Topical Anesthetic Applied: Other: lidocaine 4%, Treatment Notes Wound #2 (Right, Anterior Lower Leg) Notes prisma, ABD, 3 layer Electronic Signature(s) Signed: 11/25/2018 1:29:00 PM By: Curtis Sites Entered By: Curtis Sites on 11/25/2018 13:29:00 Kemmerling, Barbara Cower ([id number]) -------------------------------------------------------------------------------- Wound Assessment Details Patient Name: Lee Jackson Date of Service: 11/25/2018 1:00 PM Medical Record Number: [id number] Patient Account Number: [id number] Date of Birth/Sex: 1981/01/27 (38 y.o. M) Treating RN: Curtis Sites Primary Care Mauria Asquith: Jacquelin Hawking Other Clinician: Referring Luann Aspinwall: Jacquelin Hawking Treating Ruie Sendejo/Extender: Altamese [place] in Treatment: 0 Wound Status Wound Number: 3 Primary Etiology: Venous Leg Ulcer Wound Location: Left Lower Leg - Anterior, Distal Wound Status: Open Wounding Event: Blister Comorbid History: Asthma Date Acquired: 11/02/2018 Weeks Of Treatment: 0 Clustered Wound: No Photos Photo Uploaded By: Curtis Sites on 11/25/2018 13:37:39 Wound Measurements Length: (cm) 1.8 Width: (cm) 0.9 Depth: (cm) 0.1 Area: (cm) 1.272 Volume: (cm) 0.127 % Reduction in Area: % Reduction in Volume: Epithelialization: None Tunneling: No Undermining: No Wound Description Full Thickness Without Exposed Support Classification: Structures Wound Margin: Flat and Intact Exudate None Present Amount: Foul Odor After Cleansing: No Slough/Fibrino Yes Wound Bed Granulation Amount: Medium (34-66%) Exposed Structure Granulation Quality: Red Fascia Exposed: No Necrotic Amount: Medium (34-66%) Fat Layer (Subcutaneous Tissue) Exposed: Yes Necrotic Quality: Adherent Slough Tendon Exposed: No Muscle Exposed: No Joint Exposed: No Bone Exposed: No Periwound Skin  Texture Texture Color Caster, Seymore ([id number]) No Abnormalities Noted: No No Abnormalities Noted: No Callus: No Atrophie Blanche: No Crepitus: No Cyanosis: No Excoriation: No Ecchymosis: No Induration: No Erythema: No Rash: No Hemosiderin Staining: Yes Scarring: Yes Mottled: No Pallor: No Moisture Rubor: No No Abnormalities Noted: No Dry / Scaly: Yes Temperature / Pain Maceration: No Temperature: No Abnormality Wound Preparation Ulcer Cleansing: Rinsed/Irrigated with Saline Topical Anesthetic Applied: Other: lidocaine 4%, Electronic Signature(s) Signed: 11/25/2018 1:30:17 PM By: Curtis Sites Entered By: Curtis Sites on 11/25/2018 13:30:16 Stracke, Barbara Cower ([id number]) -------------------------------------------------------------------------------- Vitals Details Patient Name: Lee Jackson Date of Service: 11/25/2018 1:00 PM Medical Record Number: [id number] Patient Account Number: [id number] Date of Birth/Sex: August 31, 1981 (38 y.o. M) Treating RN: Arnette Norris Primary Care Wilbon Obenchain: Jacquelin Hawking Other Clinician: Referring Claressa Hughley: Jacquelin Hawking Treating Lastacia Solum/Extender: Altamese [place] in Treatment: 0 Vital Signs Time Taken: 13:05 Temperature (F): 98.0 Height (in): 70 Pulse (bpm): 85 Source: Stated Respiratory Rate (breaths/min): 18 Weight (lbs): 250 Blood Pressure (mmHg): 145/99 Source: Stated Reference Range: 80 - 120 mg / dl Body Mass Index (BMI): 35.9 Electronic Signature(s) Signed: 11/27/2018 3:21:09 PM By: Arnette Norris Entered By: Arnette Norris on 11/25/2018 13:07:15

## 2018-11-29 NOTE — Progress Notes (Signed)
Lee, Jackson ([id number]) Visit Report for 11/25/2018 Chief Complaint Document Details Patient Name: Lee Jackson, Lee Jackson Date of Service: 11/25/2018 1:00 PM Medical Record Number: [id number] Patient Account Number: [id number] Date of Birth/Sex: [birth year] (38 y.o. M) Treating RN: Huel Coventry Primary Care Provider: Jacquelin Hawking Other Clinician: Referring Provider: Jacquelin Hawking Treating Provider/Extender: Altamese [place]aph[place] in Treatment: 0 Information Obtained from: Patient Chief Complaint 11/25/2018; patient is here for review of wound areas on his bilateral lower extremities Electronic Signature(s) Signed: 11/25/2018 5:05:37 PM By: Baltazar Najjar MD Entered By: Baltazar Najjar on 11/25/2018 14:11:14 Lichty, Lee Jackson ([id number]) -------------------------------------------------------------------------------- HPI Details Patient Name: Lee Jackson Date of Service: 11/25/2018 1:00 PM Medical Record Number: [id number] Patient Account Number: [id number] Date of Birth/Sex: [birth year] (38 y.o. M) Treating RN: Huel Coventry Primary Care Provider: Jacquelin Hawking Other Clinician: Referring Provider: Jacquelin Hawking Treating Provider/Extender: Altamese [place] in Treatment: 0 History of Present Illness HPI Description: Admission 11/25/2018 This is a 38 year old man. exsmoker. I think he was referred here after presenting to the ER at [facility] having bilateral lower extremity ulcers on 10/22/2018. He had a negative duplex ultrasound for DVT in the right leg. It was noted that he had several wound areas including healed areas that are hyperpigmented. His lab work on the ER visit date included a comprehensive metabolic panel that was normal a CBC that was normal. His hemoglobin A1c was 5.4. He tells me that his problem began 2 years ago. He had a cervical disc fusion while he was hospitalized in [facility]. He started to develop blisters on his left leg. These were eventually  rupture on their own and then heal over. He apparently was in hospital at one point with cellulitis of his legs about a year ago and at that time was seen by wound care doctor and infectious disease. He had biopsies of these areas bilaterally. We do not have these results but the patient thinks that this showed only "inflammation". More recently he has had more wounds on his right leg. Some of these start with blisters or some just start with a burning sensation the results and skin breakdown. Currently he has 3 open areas on his right anterior pre-tibia. The longest is been there for about 4 or 5 months. These are dry nondescript wounds. They have no depth. Also interestingly he has some subdermal purpuric-looking areas that almost look like red a form purpura. I note that he has had ER visits for hematuria. Past medical history; the patient apparently had pulmonary emboli but was never proven to have lower extremity venous thrombosis. Hypothyroidism, low back pain, hypertension, hyperlipidemia, depression with anxiety, asthma, ankle surgery, cervical fusion. Socially; he is not employed has no Aeronautical engineer which will make dermatology consultation challenging. ABIs in our clinic were noncompressible bilaterally at 1.39 and 1.33 Electronic Signature(s) Signed: 11/25/2018 5:05:37 PM By: Baltazar Najjar MD Entered By: Baltazar Najjar on 11/25/2018 14:18:30 Lee Jackson, Lee Jackson ([id number]) -------------------------------------------------------------------------------- Physical Exam Details Patient Name: Lee Jackson Date of Service: 11/25/2018 1:00 PM Medical Record Number: [id number] Patient Account Number: [id number] Date of Birth/Sex: 1981/02/07 (38 y.o. M) Treating RN: Huel Coventry Primary Care Provider: Jacquelin Hawking Other Clinician: Referring Provider: Jacquelin Hawking Treating Provider/Extender: Altamese Bessie in Treatment: 0 Constitutional Patient is hypertensive.. Pulse regular and  within target range for patient.Marland Kitchen Respirations regular, non-labored and within target range.. Temperature is normal and within the target range for the patient.Marland Kitchen appears in no distress. Eyes Conjunctivae clear. No discharge. Respiratory Respiratory effort  is easy and symmetric bilaterally. Rate is normal at rest and on room air.. Bilateral breath sounds are clear and equal in all lobes with no wheezes, rales or rhonchi.. Cardiovascular Heart rhythm and rate regular, without murmur or gallop.. Pedal pulses palpable and strong bilaterally.. Minimal to no edema. His feet are warm.. Gastrointestinal (GI) Abdomen is soft and non-distended without masses or tenderness. Bowel sounds active in all quadrants.. No liver or spleen enlargement or tenderness.. Lymphatic None palpable in the cervical clavicular area. Integumentary (Hair, Skin) Multiple healed areas on the left and right lower extremities. The areas on the left are actually hyperpigmented is only open areas however on the right these are nondescript superficial areas. He has what looks to be some subdermal areas which almost look like angulated purpura. Psychiatric No evidence of depression, anxiety, or agitation. Calm, cooperative, and communicative. Appropriate interactions and affect.. Notes Wound exam; he has 3 nondescript areas on the right lower extremity. These are all superficial somewhat dry-looking. No debridement was necessary. No evidence of surrounding cellulitis. I did not see any blisters but there are multiple healed areas bilaterally. His peripheral pulses are palpable Electronic Signature(s) Signed: 11/25/2018 5:05:37 PM By: Baltazar Najjar MD Entered By: Baltazar Najjar on 11/25/2018 14:18:51 Lee Jackson, Lee Jackson ([id number]) -------------------------------------------------------------------------------- Physician Orders Details Patient Name: Lee Jackson Date of Service: 11/25/2018 1:00 PM Medical Record Number:  [id number] Patient Account Number: [id number] Date of Birth/Sex: 03/25/1981 (38 y.o. M) Treating RN: Huel Coventry Primary Care Provider: Jacquelin Hawking Other Clinician: Referring Provider: Jacquelin Hawking Treating Provider/Extender: Altamese [place] in Treatment: 0 Verbal / Phone Orders: No Diagnosis Coding Wound Cleansing Wound #1 Right,Proximal,Anterior Lower Leg o May Shower, gently pat wound dry prior to applying new dressing. Wound #2 Right,Anterior Lower Leg o May Shower, gently pat wound dry prior to applying new dressing. Wound #3 Left,Distal,Anterior Lower Leg o May Shower, gently pat wound dry prior to applying new dressing. Anesthetic (add to Medication List) Wound #1 Right,Proximal,Anterior Lower Leg o Topical Lidocaine 4% cream applied to wound bed prior to debridement (In Clinic Only). Wound #2 Right,Anterior Lower Leg o Topical Lidocaine 4% cream applied to wound bed prior to debridement (In Clinic Only). Wound #3 Left,Distal,Anterior Lower Leg o Topical Lidocaine 4% cream applied to wound bed prior to debridement (In Clinic Only). Primary Wound Dressing Wound #1 Right,Proximal,Anterior Lower Leg o Silver Collagen - hydrogel to moisten Wound #2 Right,Anterior Lower Leg o Silver Collagen - hydrogel to moisten Wound #3 Left,Distal,Anterior Lower Leg o Silver Collagen - hydrogel to moisten Secondary Dressing Wound #1 Right,Proximal,Anterior Lower Leg o ABD pad Wound #2 Right,Anterior Lower Leg o ABD pad Wound #3 Left,Distal,Anterior Lower Leg o ABD pad Dressing Change Frequency Wound #1 Right,Proximal,Anterior Lower Leg o Change dressing every week Lee Jackson, Lee Jackson ([id number]) Wound #2 Right,Anterior Lower Leg o Change dressing every week Wound #3 Left,Distal,Anterior Lower Leg o Change dressing every week Follow-up Appointments Wound #1 Right,Proximal,Anterior Lower Leg o Return Appointment in 1 week. o Nurse Visit as  needed Wound #2 Right,Anterior Lower Leg o Return Appointment in 1 week. o Nurse Visit as needed Wound #3 Left,Distal,Anterior Lower Leg o Return Appointment in 1 week. o Nurse Visit as needed Edema Control Wound #1 Right,Proximal,Anterior Lower Leg o 3 Layer Compression System - Right Lower Extremity Wound #2 Right,Anterior Lower Leg o 3 Layer Compression System - Right Lower Extremity Wound #3 Left,Distal,Anterior Lower Leg o 3 Layer Compression System - Right Lower Extremity Consults o Dermatology - [facility] Dermatology Consult Electronic  Signature(s) Signed: 11/25/2018 5:05:37 PM By: Baltazar Najjar MD Signed: 11/25/2018 5:29:19 PM By: Elliot Gurney BSN, RN, CWS, Kim RN, BSN Entered By: Elliot Gurney, BSN, RN, CWS, Kim on 11/25/2018 13:57:48 Lee Jackson, Lee Jackson ([id number]) -------------------------------------------------------------------------------- Problem List Details Patient Name: Lee Jackson Date of Service: 11/25/2018 1:00 PM Medical Record Number: [id number] Patient Account Number: [id number] Date of Birth/Sex: 03/11/1981 (38 y.o. 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 11/25/2018 No Yes limited to breakdown of skin I87.323 Chronic venous hypertension (idiopathic) with inflammation of 11/25/2018 No Yes bilateral lower extremity Inactive Problems Resolved Problems Electronic Signature(s) Signed: 11/25/2018 5:05:37 PM By: Baltazar Najjar MD Entered By: Baltazar Najjar on 11/25/2018 14:04:53 Lee Jackson, Lee Jackson ([id number]) -------------------------------------------------------------------------------- Progress Note Details Patient Name: Lee Jackson Date of Service: 11/25/2018 1:00  PM Medical Record Number: [id number] Patient Account Number: [id number] Date of Birth/Sex: 09-22-1981 (38 y.o. M) Treating RN: Huel Coventry Primary Care Provider: Jacquelin Hawking Other Clinician: Referring Provider: Jacquelin Hawking Treating Provider/Extender: Altamese [place] in Treatment: 0 Subjective Chief Complaint Information obtained from Patient 11/25/2018; patient is here for review of wound areas on his bilateral lower extremities History of Present Illness (HPI) Admission 11/25/2018 This is a 38 year old man. exsmoker. I think he was referred here after presenting to the ER at [facility] having bilateral lower extremity ulcers on 10/22/2018. He had a negative duplex ultrasound for DVT in the right leg. It was noted that he had several wound areas including healed areas that are hyperpigmented. His lab work on the ER visit date included a comprehensive metabolic panel that was normal a CBC that was normal. His hemoglobin A1c was 5.4. He tells me that his problem began 2 years ago. He had a cervical disc fusion while he was hospitalized in [facility]. He started to develop blisters on his left leg. These were eventually rupture on their own and then heal over. He apparently was in hospital at one point with cellulitis of his legs about a year ago and at that time was seen by wound care doctor and infectious disease. He had biopsies of these areas bilaterally. We do not have these results but the patient thinks that this showed only "inflammation". More recently he has had more wounds on his right leg. Some of these start with blisters or some just start with a burning sensation the results and skin breakdown. Currently he has 3 open areas on his right anterior pre-tibia. The longest is been there for about 4 or 5 months. These are dry nondescript wounds. They have no depth. Also interestingly he has some subdermal purpuric-looking areas that almost look like  red a form purpura. I note that he has had ER visits for hematuria. Past medical history; the patient apparently had pulmonary emboli but was never proven to have lower extremity venous thrombosis. Hypothyroidism, low back pain, hypertension, hyperlipidemia, depression with anxiety, asthma, ankle surgery, cervical fusion. Socially; he is not employed has no Aeronautical engineer which will make dermatology consultation challenging. ABIs in our clinic were noncompressible bilaterally at 1.39 and 1.33 Wound History Patient presents with 3 open wounds that have been present for approximately 2 years. Patient has been treating wounds in the following manner: non-adherent dressing, wound cleanser. The wounds have been healed in the past but have  re- opened. Laboratory tests have been performed in the last month. Patient reportedly has not tested positive for an antibiotic resistant organism. Patient reportedly has not tested positive for osteomyelitis. Patient reportedly has had testing performed to evaluate circulation in the legs. Patient experiences the following problems associated with their wounds: swelling. Patient History Information obtained from Patient. Allergies penicillin (Severity: Moderate, Reaction: hives), Mucinex (Severity: Severe, Reaction: throat swelling), Sudafed (Severity: Moderate, Reaction: hives), Prevacid (Severity: Moderate, Reaction: hives), vancomycin (Severity: Severe, Reaction: red mans syndrome) Lee Jackson, Lee Jackson ([id number]) Social History Former smoker - ended on 09/24/2011, Marital Status - Single, Alcohol Use - Never, Drug Use - No History, Caffeine Use - Daily - soda. Medical History Eyes Denies history of Cataracts, Glaucoma, Optic Neuritis Ear/Nose/Mouth/Throat Denies history of Chronic sinus problems/congestion, Middle ear problems Hematologic/Lymphatic Denies history of Anemia, Hemophilia, Human Immunodeficiency Virus, Lymphedema, Sickle Cell  Disease Respiratory Patient has history of Asthma Denies history of Aspiration, Chronic Obstructive Pulmonary Disease (COPD), Pneumothorax, Sleep Apnea, Tuberculosis Cardiovascular Denies history of Angina, Arrhythmia, Congestive Heart Failure, Coronary Artery Disease, Deep Vein Thrombosis, Hypertension, Hypotension, Myocardial Infarction, Peripheral Arterial Disease, Peripheral Venous Disease, Phlebitis, Vasculitis Gastrointestinal Denies history of Cirrhosis , Colitis, Crohn s, Hepatitis A, Hepatitis B, Hepatitis C Endocrine Denies history of Type I Diabetes, Type II Diabetes Genitourinary Denies history of End Stage Renal Disease Immunological Denies history of Lupus Erythematosus, Raynaud s, Scleroderma Integumentary (Skin) Denies history of History of Burn, History of pressure wounds Musculoskeletal Denies history of Gout, Rheumatoid Arthritis, Osteoarthritis, Osteomyelitis Neurologic Denies history of Dementia, Neuropathy, Quadriplegia, Paraplegia, Seizure Disorder Oncologic Denies history of Received Chemotherapy, Received Radiation Psychiatric Denies history of Anorexia/bulimia, Confinement Anxiety Hospitalization/Surgery History - 01/21/2018, [facility], leg pain. Review of Systems (ROS) Constitutional Symptoms (General Health) The patient has no complaints or symptoms. Eyes The patient has no complaints or symptoms. Ear/Nose/Mouth/Throat The patient has no complaints or symptoms. Hematologic/Lymphatic The patient has no complaints or symptoms. Respiratory The patient has no complaints or symptoms. Cardiovascular Denies complaints or symptoms of Chest pain. Gastrointestinal The patient has no complaints or symptoms. Endocrine Complains or has symptoms of Thyroid disease - hypothyroidism. Denies complaints or symptoms of Hepatitis, Polydypsia (Excessive Thirst). Genitourinary Lee Jackson, Lee Jackson ([id number]) The patient has no complaints or  symptoms. Immunological Complains or has symptoms of Itching. Denies complaints or symptoms of Hives. Integumentary (Skin) Complains or has symptoms of Wounds - 3. Denies complaints or symptoms of Bleeding or bruising tendency, Breakdown, Swelling. Musculoskeletal Complains or has symptoms of Muscle Pain - bilateral legs, Muscle Weakness - bilateral legs. Neurologic Complains or has symptoms of Numbness/parasthesias - bilateral legs, Focal/Weakness - bilateral legs. Oncologic The patient has no complaints or symptoms. Psychiatric Complains or has symptoms of Anxiety. Denies complaints or symptoms of Claustrophobia. Objective Constitutional Patient is hypertensive.. Pulse regular and within target range for patient.Marland Kitchen Respirations regular, non-labored and within target range.. Temperature is normal and within the target range for the patient.Marland Kitchen appears in no distress. Vitals Time Taken: 1:05 PM, Height: 70 in, Source: Stated, Weight: 250 lbs, Source: Stated, BMI: 35.9, Temperature: 98.0 F, Pulse: 85 bpm, Respiratory Rate: 18 breaths/min, Blood Pressure: 145/99 mmHg. Eyes Conjunctivae clear. No discharge. Respiratory Respiratory effort is easy and symmetric bilaterally. Rate is normal at rest and on room air.. Bilateral breath sounds are clear and equal in all lobes with no wheezes, rales or rhonchi.. Cardiovascular Heart rhythm and rate regular, without murmur or gallop.. Pedal pulses palpable and strong bilaterally.. Minimal to no edema. His feet are warm.Marland Kitchen  Gastrointestinal (GI) Abdomen is soft and non-distended without masses or tenderness. Bowel sounds active in all quadrants.. No liver or spleen enlargement or tenderness.. Lymphatic None palpable in the cervical clavicular area. Psychiatric No evidence of depression, anxiety, or agitation. Calm, cooperative, and communicative. Appropriate interactions and affect.. General Notes: Wound exam; he has 3 nondescript areas on the  right lower extremity. These are all superficial somewhat dry-looking. No debridement was necessary. No evidence of surrounding cellulitis. I did not see any blisters but there are Lee Jackson, Lee Jackson ([id number]) multiple healed areas bilaterally. His peripheral pulses are palpable Integumentary (Hair, Skin) Multiple healed areas on the left and right lower extremities. The areas on the left are actually hyperpigmented is only open areas however on the right these are nondescript superficial areas. He has what looks to be some subdermal areas which almost look like angulated purpura. Wound #1 status is Open. Original cause of wound was Blister. The wound is located on the Right,Proximal,Anterior Lower Leg. The wound measures 0.5cm length x 0.5cm width x 0.1cm depth; 0.196cm^2 area and 0.02cm^3 volume. There is Fat Layer (Subcutaneous Tissue) Exposed exposed. There is no tunneling or undermining noted. There is a none present amount of drainage noted. The wound margin is flat and intact. There is medium (34-66%) red granulation within the wound bed. There is a medium (34-66%) amount of necrotic tissue within the wound bed including Adherent Slough. The periwound skin appearance exhibited: Scarring, Dry/Scaly, Hemosiderin Staining. The periwound skin appearance did not exhibit: Callus, Crepitus, Excoriation, Induration, Rash, Maceration, Atrophie Blanche, Cyanosis, Ecchymosis, Mottled, Pallor, Rubor, Erythema. Periwound temperature was noted as No Abnormality. Wound #2 status is Open. Original cause of wound was Blister. The wound is located on the Right,Anterior Lower Leg. The wound measures 0.9cm length x 0.3cm width x 0.1cm depth; 0.212cm^2 area and 0.021cm^3 volume. There is Fat Layer (Subcutaneous Tissue) Exposed exposed. There is no tunneling or undermining noted. There is a none present amount of drainage noted. The wound margin is flat and intact. There is medium (34-66%) red granulation within the  wound bed. There is a medium (34-66%) amount of necrotic tissue within the wound bed including Adherent Slough. The periwound skin appearance exhibited: Scarring, Dry/Scaly, Hemosiderin Staining. The periwound skin appearance did not exhibit: Callus, Crepitus, Excoriation, Induration, Rash, Maceration, Atrophie Blanche, Cyanosis, Ecchymosis, Mottled, Pallor, Rubor, Erythema. Periwound temperature was noted as No Abnormality. Wound #3 status is Open. Original cause of wound was Blister. The wound is located on the Right,Distal,Anterior Lower Leg. The wound measures 1.8cm length x 0.9cm width x 0.1cm depth; 1.272cm^2 area and 0.127cm^3 volume. There is Fat Layer (Subcutaneous Tissue) Exposed exposed. There is no tunneling or undermining noted. There is a none present amount of drainage noted. The wound margin is flat and intact. There is medium (34-66%) red granulation within the wound bed. There is a medium (34-66%) amount of necrotic tissue within the wound bed including Adherent Slough. The periwound skin appearance exhibited: Scarring, Dry/Scaly, Hemosiderin Staining. The periwound skin appearance did not exhibit: Callus, Crepitus, Excoriation, Induration, Rash, Maceration, Atrophie Blanche, Cyanosis, Ecchymosis, Mottled, Pallor, Rubor, Erythema. Periwound temperature was noted as No Abnormality. 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 There was a Three Layer Compression Therapy Procedure with a pre-treatment ABI  of 1.3 by Huel Coventry, RN. Post procedure Diagnosis Wound #1: Same as Pre-Procedure Lee Jackson, Lee Jackson ([id number]) Plan Wound Cleansing: Wound #1 Right,Proximal,Anterior Lower Leg: May  Shower, gently pat wound dry prior to applying new dressing. Wound #2 Right,Anterior Lower Leg: May Shower, gently pat wound dry prior to applying new dressing. Wound #3 Left,Distal,Anterior Lower Leg: May Shower, gently pat wound dry prior to applying new dressing. Anesthetic (add to Medication List): Wound #1 Right,Proximal,Anterior Lower Leg: Topical Lidocaine 4% cream applied to wound bed prior to debridement (In Clinic Only). Wound #2 Right,Anterior Lower Leg: Topical Lidocaine 4% cream applied to wound bed prior to debridement (In Clinic Only). Wound #3 Left,Distal,Anterior Lower Leg: Topical Lidocaine 4% cream applied to wound bed prior to debridement (In Clinic Only). Primary Wound Dressing: Wound #1 Right,Proximal,Anterior Lower Leg: Silver Collagen - hydrogel to moisten Wound #2 Right,Anterior Lower Leg: Silver Collagen - hydrogel to moisten Wound #3 Left,Distal,Anterior Lower Leg: Silver Collagen - hydrogel to moisten Secondary Dressing: Wound #1 Right,Proximal,Anterior Lower Leg: ABD pad Wound #2 Right,Anterior Lower Leg: ABD pad Wound #3 Left,Distal,Anterior Lower Leg: ABD pad Dressing Change Frequency: Wound #1 Right,Proximal,Anterior Lower Leg: Change dressing every week Wound #2 Right,Anterior Lower Leg: Change dressing every week Wound #3 Left,Distal,Anterior Lower Leg: Change dressing every week Follow-up Appointments: Wound #1 Right,Proximal,Anterior Lower Leg: Return Appointment in 1 week. Nurse Visit as needed Wound #2 Right,Anterior Lower Leg: Return Appointment in 1 week. Nurse Visit as needed Wound #3 Left,Distal,Anterior Lower Leg: Return Appointment in 1 week. Nurse Visit as needed Edema Control: Wound #1 Right,Proximal,Anterior Lower Leg: 3 Layer Compression System - Right Lower Extremity Wound #2 Right,Anterior Lower Leg: 3 Layer Compression System - Right Lower Extremity Wound #3 Left,Distal,Anterior Lower Leg: 3 Layer Compression System -  Right Lower Extremity Consults ordered were: Dermatology - [facility] Lee Jackson, Lee Jackson ([id number]) 1. I was expecting to see evidence of chronic venous insufficiency as a cause for the bilateral wounds but I think the evidence for this is underwhelming at the bedside. His description of blisters in the lower extremities would raise the possibility of a blistering skin disease. He apparently had biopsies done while he was in hospital although I wonder whether these were done properly for direct immunofluorescence. Some of what he has on the right leg almost looks vasculitic/vasculopathy and I would wonder about another biopsy in these areas. He probably needs lab work to include work-up for vasculitis, vasculopathy, cryoglobulins etc. 2. The areas that are open are small and dry. We will put silver collagen and I will put him under compression. He was a bit indignant when I asked if he was scratching or picking at these areas although our nurses who did the intake seem to think that might be the case he vehemently denied it as did his girlfriend was present. 3. I am going to try to see if we can get him into see a dermatologist particularly in academic dermatologist. As far as I am aware of the clinic at [facility] is not charge an upfront co-pay which the patient cannot afford Electronic Signature(s) Signed: 11/25/2018 5:05:37 PM By: Baltazar Najjar MD Entered By: Baltazar Najjar on 11/25/2018 14:21:20 Lee Jackson ([id number]) -------------------------------------------------------------------------------- ROS/PFSH Details Patient Name: Lee Jackson Date of Service: 11/25/2018 1:00 PM Medical Record Number: [id number] Patient Account Number: [id number] Date of Birth/Sex: October 16, 1980 (38 y.o. M) Treating RN: Arnette Norris Primary Care Provider: Jacquelin Hawking Other Clinician: Referring Provider: Jacquelin Hawking Treating Provider/Extender: Maxwell Caul Weeks in Treatment:  0  Information Obtained From Patient Wound History Do you currently have one or more open woundso Yes How many open wounds do you currently haveo 3 Approximately how long have you had your woundso 2 years How have you been treating your wound(s) until nowo non-adherent dressing, wound cleanser Has your wound(s) ever healed and then re-openedo Yes Have you had any lab work done in the past montho Yes Who ordered the lab work doneo [facility] Have you tested positive for an antibiotic resistant organism (MRSA, VRE)o No Have you tested positive for osteomyelitis (bone infection)o No Have you had any tests for circulation on your legso Yes Who ordered the testo ED Where was the test [facility] Have you had other problems associated with your woundso Swelling Cardiovascular Complaints and Symptoms: Negative for: Chest pain Medical History: Negative for: Angina; Arrhythmia; Congestive Heart Failure; Coronary Artery Disease; Deep Vein Thrombosis; Hypertension; Hypotension; Myocardial Infarction; Peripheral Arterial Disease; Peripheral Venous Disease; Phlebitis; Vasculitis Endocrine Complaints and Symptoms: Positive for: Thyroid disease - hypothyroidism Negative for: Hepatitis; Polydypsia (Excessive Thirst) Medical History: Negative for: Type I Diabetes; Type II Diabetes Immunological Complaints and Symptoms: Positive for: Itching Negative for: Hives Medical History: Negative for: Lupus Erythematosus; Raynaudos; Scleroderma Integumentary (Skin) Lee Jackson, Lee Jackson ([id number]) Complaints and Symptoms: Positive for: Wounds - 3 Negative for: Bleeding or bruising tendency; Breakdown; Swelling Medical History: Negative for: History of Burn; History of pressure wounds Musculoskeletal Complaints and Symptoms: Positive for: Muscle Pain - bilateral legs; Muscle Weakness - bilateral legs Medical History: Negative for: Gout; Rheumatoid Arthritis; Osteoarthritis;  Osteomyelitis Neurologic Complaints and Symptoms: Positive for: Numbness/parasthesias - bilateral legs; Focal/Weakness - bilateral legs Medical History: Negative for: Dementia; Neuropathy; Quadriplegia; Paraplegia; Seizure Disorder Psychiatric Complaints and Symptoms: Positive for: Anxiety Negative for: Claustrophobia Medical History: Negative for: Anorexia/bulimia; Confinement Anxiety Constitutional Symptoms (General Health) Complaints and Symptoms: No Complaints or Symptoms Eyes Complaints and Symptoms: No Complaints or Symptoms Medical History: Negative for: Cataracts; Glaucoma; Optic Neuritis Ear/Nose/Mouth/Throat Complaints and Symptoms: No Complaints or Symptoms Medical History: Negative for: Chronic sinus problems/congestion; Middle ear problems Hematologic/Lymphatic Complaints and Symptoms: No Complaints or Symptoms Medical HistoryCREW, Lee Jackson ([id number]) Negative for: Anemia; Hemophilia; Human Immunodeficiency Virus; Lymphedema; Sickle Cell Disease Respiratory Complaints and Symptoms: No Complaints or Symptoms Medical History: Positive for: Asthma Negative for: Aspiration; Chronic Obstructive Pulmonary Disease (COPD); Pneumothorax; Sleep Apnea; Tuberculosis Gastrointestinal Complaints and Symptoms: No Complaints or Symptoms Medical History: Negative for: Cirrhosis ; Colitis; Crohnos; Hepatitis A; Hepatitis B; Hepatitis C Genitourinary Complaints and Symptoms: No Complaints or Symptoms Medical History: Negative for: End Stage Renal Disease Oncologic Complaints and Symptoms: No Complaints or Symptoms Medical History: Negative for: Received Chemotherapy; Received Radiation Immunizations Pneumococcal Vaccine: Received Pneumococcal Vaccination: Yes Implantable Devices None Hospitalization / Surgery History Name of Hospital Purpose of Hospitalization/Surgery Date [facility] leg pain 01/21/2018 Family and Social History Former smoker - ended on  09/24/2011; Marital Status - Single; Alcohol Use: Never; Drug Use: No History; Caffeine Use: Daily - soda; Financial Concerns: No; Food, Clothing or Shelter Needs: No; Support System Lacking: No; Transportation Concerns: No; Advanced Directives: No; Living Will: No; Medical Power of Attorney: No Electronic Signature(s) Signed: 11/25/2018 5:05:37 PM By: Baltazar Najjar MD Signed: 11/27/2018 3:21:09 PM By: Arnette Norris Entered By: Arnette Norris on 11/25/2018 13:20:20 Schmid, Lee Jackson ([id number]) -------------------------------------------------------------------------------- SuperBill Details Patient Name: Lee Jackson Date of Service: 11/25/2018 Medical Record Number: [id number] Patient Account Number: [id number] Date of Birth/Sex: 06-30-1981 (38 y.o. 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 hypertension (idiopathic) with inflammation of bilateral lower extremity Facility Procedures CPT4 Code: [id number] Description: 99213 - WOUND CARE VISIT-LEV 3 EST PT Modifier: Quantity: 1 Physician Procedures CPT4 Code Description: 8295621 30865 - WC PHYS LEVEL 4 - NEW PT ICD-10 Diagnosis Description 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

## 2018-11-29 NOTE — Progress Notes (Signed)
Lee Jackson, Lee Jackson ([id number]) Visit Report for 11/25/2018 Abuse/Suicide Risk Screen Details Patient Name: Lee Jackson, Lee Jackson Date of Service: 11/25/2018 1:00 PM Medical Record Number: [id number] Patient Account Number: [id number] Date of Birth/Sex: 08-06-1981 (38 y.o. M) Treating RN: Arnette Norris Primary Care Jordyn Doane: Jacquelin Hawking Other Clinician: Referring Niesha Bame: Jacquelin Hawking Treating Mattye Verdone/Extender: Altamese [place] in Treatment: 0 Abuse/Suicide Risk Screen Items Answer ABUSE/SUICIDE RISK SCREEN: Has anyone close to you tried to hurt or harm you recentlyo No Do you feel uncomfortable with anyone in your familyo No Has anyone forced you do things that you didnot want to doo No Do you have any thoughts of harming yourselfo No Patient displays signs or symptoms of abuse and/or neglect. No Electronic Signature(s) Signed: 11/27/2018 3:21:09 PM By: Arnette Norris Entered By: Arnette Norris on 11/25/2018 13:20:33 Ohanesian, Barbara Cower ([id number]) -------------------------------------------------------------------------------- Activities of Daily Living Details Patient Name: Lee Jackson Date of Service: 11/25/2018 1:00 PM Medical Record Number: [id number] Patient Account Number: [id number] Date of Birth/Sex: 03-14-1981 (38 y.o. M) Treating RN: Arnette Norris Primary Care Mckell Riecke: Jacquelin Hawking Other Clinician: Referring Jezelle Gullick: Jacquelin Hawking Treating Tremaine Earwood/Extender: Altamese [place] in Treatment: 0 Activities of Daily Living Items Answer Activities of Daily Living (Please select one for each item) Drive Automobile Completely Able Take Medications Completely Able Use Telephone Completely Able Care for Appearance Completely Able Use Toilet Completely Able Bath / Shower Completely Able Dress Self Completely Able Feed Self Completely Able Walk Completely Able Get In / Out Bed Completely Able Housework Completely Able Prepare Meals Completely Able Handle Money  Completely Able Shop for Self Completely Able Electronic Signature(s) Signed: 11/27/2018 3:21:09 PM By: Arnette Norris Entered By: Arnette Norris on 11/25/2018 13:20:44 Mcclure, Barbara Cower ([id number]) -------------------------------------------------------------------------------- Education Assessment Details Patient Name: Lee Jackson Date of Service: 11/25/2018 1:00 PM Medical Record Number: [id number] Patient Account Number: [id number] Date of Birth/Sex: 12-24-1980 (38 y.o. M) Treating RN: Arnette Norris Primary Care Harvest Stanco: Jacquelin Hawking Other Clinician: Referring Krina Mraz: Jacquelin Hawking Treating Toney Difatta/Extender: Altamese [place] in Treatment: 0 Primary Learner Assessed: Patient Learning Preferences/Education Level/Primary Language Learning Preference: Explanation Highest Education Level: High School Preferred Language: English Cognitive Barrier Assessment/Beliefs Language Barrier: No Translator Needed: No Memory Deficit: No Emotional Barrier: No Cultural/Religious Beliefs Affecting Medical Care: No Physical Barrier Assessment Impaired Vision: No Impaired Hearing: No Decreased Hand dexterity: No Knowledge/Comprehension Assessment Knowledge Level: High Comprehension Level: High Ability to understand written High instructions: Ability to understand verbal High instructions: Motivation Assessment Anxiety Level: Calm Cooperation: Cooperative Education Importance: Acknowledges Need Interest in Health Problems: Asks Questions Perception: Coherent Willingness to Engage in Self- High Management Activities: Readiness to Engage in Self- High Management Activities: Electronic Signature(s) Signed: 11/27/2018 3:21:09 PM By: Arnette Norris Entered By: Arnette Norris on 11/25/2018 13:21:18 Benefiel, Barbara Cower ([id number]) -------------------------------------------------------------------------------- Fall Risk Assessment Details Patient Name: Lee Jackson Date of Service:  11/25/2018 1:00 PM Medical Record Number: [id number] Patient Account Number: [id number] Date of Birth/Sex: [birth year] (38 y.o. M) Treating RN: Arnette Norris Primary Care Mark Benecke: Jacquelin Hawking Other Clinician: Referring Sammy Cassar: Jacquelin Hawking Treating Maryssa Giampietro/Extender: Altamese [place] in Treatment: 0 Fall Risk Assessment Items Have you had 2 or more falls in the last 12 monthso 0 Yes Have you had any fall that resulted in injury in the last 12 monthso 0 No FALL RISK ASSESSMENT: History of falling - immediate or within 3 months 25 Yes Secondary diagnosis 0 No Ambulatory aid None/bed rest/wheelchair/nurse 0 No Crutches/cane/walker 0 No Furniture 0 No IV Access/Saline Lock 0 No Gait/Training Normal/bed rest/immobile  0 No Weak 10 Yes Impaired 0 No Mental Status Oriented to own ability 0 Yes Electronic Signature(s) Signed: 11/27/2018 3:21:09 PM By: Arnette Norris Entered By: Arnette Norris on 11/25/2018 13:21:51 Bruins, Barbara Cower ([id number]) -------------------------------------------------------------------------------- Foot Assessment Details Patient Name: Lee Jackson Date of Service: 11/25/2018 1:00 PM Medical Record Number: [id number] Patient Account Number: [id number] Date of Birth/Sex: 09/27/1980 (38 y.o. M) Treating RN: Arnette Norris Primary Care Felicia Bloomquist: Jacquelin Hawking Other Clinician: Referring Waverly Chavarria: Jacquelin Hawking Treating Augustine Brannick/Extender: Altamese [place] in Treatment: 0 Foot Assessment Items Site Locations + = Sensation present, - = Sensation absent, C = Callus, U = Ulcer R = Redness, W = Warmth, M = Maceration, PU = Pre-ulcerative lesion F = Fissure, S = Swelling, D = Dryness Assessment Right: Left: Other Deformity: No No Prior Foot Ulcer: No No Prior Amputation: No No Charcot Joint: No No Ambulatory Status: Gait: Electronic Signature(s) Signed: 11/27/2018 3:21:09 PM By: Arnette Norris Entered By: Arnette Norris on 11/25/2018  13:26:31 Justiniano, Barbara Cower ([id number]) -------------------------------------------------------------------------------- Nutrition Risk Assessment Details Patient Name: Lee Jackson Date of Service: 11/25/2018 1:00 PM Medical Record Number: [id number] Patient Account Number: [id number] Date of Birth/Sex: [birth year] (38 y.o. M) Treating RN: Arnette Norris Primary Care Clista Rainford: Jacquelin Hawking Other Clinician: Referring Skilynn Durney: Jacquelin Hawking Treating Anjuli Gemmill/Extender: Altamese [place] in Treatment: 0 Height (in): 70 Weight (lbs): 250 Body Mass Index (BMI): 35.9 Nutrition Risk Assessment Items NUTRITION RISK SCREEN: I have an illness or condition that made me change the kind and/or amount of 0 No food I eat I eat fewer than two meals per day 3 Yes I eat few fruits and vegetables, or milk products 0 No I have three or more drinks of beer, liquor or wine almost every day 0 No I have tooth or mouth problems that make it hard for me to eat 0 No I don't always have enough money to buy the food I need 0 No I eat alone most of the time 0 No I take three or more different prescribed or over-the-counter drugs a day 1 Yes Without wanting to, I have lost or gained 10 pounds in the last six months 0 No I am not always physically able to shop, cook and/or feed myself 0 No Nutrition Protocols Good Risk Protocol Moderate Risk Protocol Electronic Signature(s) Signed: 11/27/2018 3:21:09 PM By: Arnette Norris Entered By: Arnette Norris on 11/25/2018 13:22:09

## 2018-12-02 ENCOUNTER — Encounter: Payer: Self-pay | Admitting: Internal Medicine

## 2018-12-02 ENCOUNTER — Other Ambulatory Visit: Payer: Self-pay

## 2018-12-03 NOTE — Progress Notes (Addendum)
ALEXYS, ORRIS ([id number]) Visit Report for 12/02/2018 Arrival Information Details Patient Name: Lee Jackson, Lee Jackson Date of Service: 12/02/2018 2:15 PM Medical Record Number: [id number] Patient Account Number: [id number] Date of Birth/Sex: 1981/03/03 (38 y.o. M) Treating RN: Rodell Perna Primary Care Quantia Grullon: Jacquelin Hawking Other Clinician: Referring Consuelo Suthers: Jacquelin Hawking Treating Romelle Reiley/Extender: Altamese [place] in Treatment: 1 Visit Information History Since Last Visit Added or deleted any medications: No Patient Arrived: Ambulatory Any new allergies or adverse reactions: No Arrival Time: 14:14 Had a fall or experienced change in No Accompanied By: girlfriend activities of daily living that may affect Transfer Assistance: None risk of falls: Signs or symptoms of abuse/neglect since last visito No Hospitalized since last visit: No Pain Present Now: Yes Electronic Signature(s) Signed: 12/02/2018 3:02:20 PM By: Rodell Perna Entered By: Rodell Perna on 12/02/2018 14:15:37 Lupercio, Barbara Cower ([id number]) -------------------------------------------------------------------------------- Clinic Level of Care Assessment Details Patient Name: Lee Jackson Date of Service: 12/02/2018 2:15 PM Medical Record Number: [id number] Patient Account Number: [id number] Date of Birth/Sex: Jan 29, 1981 (38 y.o. M) Treating RN: Huel Coventry Primary Care Nicolae Vasek: Jacquelin Hawking Other Clinician: Referring Delenn Ahn: Jacquelin Hawking Treating Thayer Inabinet/Extender: [facility] [place] in Treatment: 1 Clinic Level of Care Assessment Items TOOL 4 Quantity Score []  - Use when only an EandM is performed on FOLLOW-UP visit 0 ASSESSMENTS - Nursing Assessment / Reassessment []  - Reassessment of Co-morbidities (includes updates in patient status) 0 X- 1 5 Reassessment of Adherence to Treatment Plan ASSESSMENTS - Wound and Skin Assessment / Reassessment X - Simple Wound Assessment / Reassessment - one wound 1  5 []  - 0 Complex Wound Assessment / Reassessment - multiple wounds []  - 0 Dermatologic / Skin Assessment (not related to wound area) ASSESSMENTS - Focused Assessment []  - Circumferential Edema Measurements - multi extremities 0 []  - 0 Nutritional Assessment / Counseling / Intervention []  - 0 Lower Extremity Assessment (monofilament, tuning fork, pulses) []  - 0 Peripheral Arterial Disease Assessment (using hand held doppler) ASSESSMENTS - Ostomy and/or Continence Assessment and Care []  - Incontinence Assessment and Management 0 []  - 0 Ostomy Care Assessment and Management (repouching, etc.) PROCESS - Coordination of Care X - Simple Patient / Family Education for ongoing care 1 15 []  - 0 Complex (extensive) Patient / Family Education for ongoing care []  - 0 Staff obtains Chiropractor, Records, Test Results / Process Orders []  - 0 Staff telephones HHA, Nursing Homes / Clarify orders / etc []  - 0 Routine Transfer to another Facility (non-emergent condition) []  - 0 Routine Hospital Admission (non-emergent condition) []  - 0 New Admissions / Manufacturing engineer / Ordering NPWT, Apligraf, etc. []  - 0 Emergency Hospital Admission (emergent condition) X- 1 10 Simple Discharge Coordination Deprey, Cortavius ([id number]) []  - 0 Complex (extensive) Discharge Coordination PROCESS - Special Needs []  - Pediatric / Minor Patient Management 0 []  - 0 Isolation Patient Management []  - 0 Hearing / Language / Visual special needs []  - 0 Assessment of Community assistance (transportation, D/C planning, etc.) []  - 0 Additional assistance / Altered mentation []  - 0 Support Surface(s) Assessment (bed, cushion, seat, etc.) INTERVENTIONS - Wound Cleansing / Measurement X - Simple Wound Cleansing - one wound 1 5 []  - 0 Complex Wound Cleansing - multiple wounds X- 1 5 Wound Imaging (photographs - any number of wounds) []  - 0 Wound Tracing (instead of photographs) X- 1 5 Simple Wound  Measurement - one wound []  - 0 Complex Wound Measurement - multiple wounds INTERVENTIONS - Wound Dressings []  - Small Wound Dressing  one or multiple wounds 0  - 0 Medium Wound Dressing one or multiple wounds  - 0 Large Wound Dressing one or multiple wounds  - 0 Application of Medications - topical  - 0 Application of Medications - injection INTERVENTIONS - Miscellaneous  - External ear exam 0  - 0 Specimen Collection (cultures, biopsies, blood, body fluids, etc.)  - 0 Specimen(s) / Culture(s) sent or taken to Lab for analysis  - 0 Patient Transfer (multiple staff / Nurse, adult / Similar devices)  - 0 Simple Staple / Suture removal (25 or less)  - 0 Complex Staple / Suture removal (26 or more)  - 0 Hypo / Hyperglycemic Management (close monitor of Blood Glucose)  - 0 Ankle / Brachial Index (ABI) - do not check if billed separately X- 1 5 Vital Signs Peitz, Rodriquez ([id number]) Has the patient been seen at the hospital within the last three years: Yes Total Score: 55 Level Of Care: New/Established - Level 2 Electronic Signature(s) Signed: 12/02/2018 5:23:21 PM By: Elliot Gurney, BSN, RN, CWS, Kim RN, BSN Entered By: Elliot Gurney, BSN, RN, CWS, Kim on 12/02/2018 15:02:42 Lee Jackson ([id number]) -------------------------------------------------------------------------------- Lower Extremity Assessment Details Patient Name: Lee Jackson Date of Service: 12/02/2018 2:15 PM Medical Record Number: [id number] Patient Account Number: [id number] Date of Birth/Sex: 1981/04/11 (38 y.o. M) Treating RN: Rodell Perna Primary Care Kaydon Creedon: Jacquelin Hawking Other Clinician: Referring Kedric Bumgarner: Jacquelin Hawking Treating Abhimanyu Cruces/Extender: Maxwell Caul Weeks in Treatment: 1 Edema Assessment Assessed: [Left: No] [Right: No] Edema: [Left: N] [Right: o] Electronic Signature(s) Signed: 12/02/2018 3:02:20 PM By: Rodell Perna Entered By: Rodell Perna on 12/02/2018 14:27:54 Wuellner,  Gerson ([id number]) -------------------------------------------------------------------------------- Multi Wound Chart Details Patient Name: Lee Jackson Date of Service: 12/02/2018 2:15 PM Medical Record Number: [id number] Patient Account Number: [id number] Date of Birth/Sex: [birth year] (38 y.o. M) Treating RN: Huel Coventry Primary Care Odessa Morren: Jacquelin Hawking Other Clinician: Referring Carys Malina: Jacquelin Hawking Treating Laren Whaling/Extender: Altamese [place] in Treatment: 1 Vital Signs Height(in): 70 Pulse(bpm): 86 Weight(lbs): 250 Blood Pressure(mmHg): 151/90 Body Mass Index(BMI): 36 Temperature(F): 98.3 Respiratory Rate 16 (breaths/min): Photos: Wound Location: Right, Proximal, Anterior Right, Anterior Lower Leg Right, Distal, Anterior Lower Lower Leg Leg Wounding Event: Blister Blister Blister Primary Etiology: Venous Leg Ulcer Venous Leg Ulcer Venous Leg Ulcer Comorbid History: Asthma Asthma Asthma Date Acquired: 11/02/2018 11/02/2018 11/02/2018 Weeks of Treatment: Wound Status: Healed - Epithelialized Healed - Epithelialized Healed - Epithelialized Measurements L x W x D 0x0x0 0x0x0 0x0x0 (cm) Area (cm) : 0 0 0 Volume (cm) : 0 0 0 % Reduction in Area: 100.00% 100.00% 100.00% % Reduction in Volume: 100.00% 100.00% 100.00% Classification: Full Thickness Without Full Thickness Without Full Thickness Without Exposed Support Structures Exposed Support Structures Exposed Support Structures Exudate Amount: None Present None Present None Present Wound Margin: Flat and Intact Flat and Intact Flat and Intact Granulation Amount: Medium (34-66%) Medium (34-66%) Medium (34-66%) Granulation Quality: Red Red Red Necrotic Amount: None Present (0%) None Present (0%) Medium (34-66%) Necrotic Tissue: N/A N/A Eschar, Adherent Slough Exposed Structures: Fat Layer (Subcutaneous Fat Layer (Subcutaneous Fat Layer (Subcutaneous Tissue) Exposed: Yes Tissue) Exposed: Yes Tissue)  Exposed: Yes Fascia: No Fascia: No Fascia: No Tendon: No Tendon: No Tendon: No Muscle: No Muscle: No Muscle: No Joint: No Joint: No Joint: No Bone: No Bone: No Bone: No Mifsud, Kavi ([id number]) Epithelialization: Small (1-33%) Small (1-33%) None Periwound Skin Texture: Scarring: Yes Scarring: Yes Scarring: Yes Excoriation: No Excoriation: No Excoriation: No Induration: No Induration: No Induration:  No Callus: No Callus: No Callus: No Crepitus: No Crepitus: No Crepitus: No Rash: No Rash: No Rash: No Periwound Skin Moisture: Dry/Scaly: Yes Dry/Scaly: Yes Dry/Scaly: Yes Maceration: No Maceration: No Maceration: No Periwound Skin Color: Hemosiderin Staining: Yes Hemosiderin Staining: Yes Hemosiderin Staining: Yes Atrophie Blanche: No Atrophie Blanche: No Atrophie Blanche: No Cyanosis: No Cyanosis: No Cyanosis: No Ecchymosis: No Ecchymosis: No Ecchymosis: No Erythema: No Erythema: No Erythema: No Mottled: No Mottled: No Mottled: No Pallor: No Pallor: No Pallor: No Rubor: No Rubor: No Rubor: No Temperature: No Abnormality No Abnormality No Abnormality Tenderness on Palpation: Yes No No Wound Preparation: Ulcer Cleansing: Ulcer Cleansing: Ulcer Cleansing: Rinsed/Irrigated with Saline Rinsed/Irrigated with Saline Rinsed/Irrigated with Saline Topical Anesthetic Applied: Topical Anesthetic Applied: Topical Anesthetic Applied: None Other: lidocaine 4% Other: lidocaine 4% Treatment Notes Electronic Signature(s) Signed: 12/16/2018 5:18:51 PM By: Baltazar Najjar MD Previous Signature: 12/02/2018 5:23:21 PM Version By: Elliot Gurney, BSN, RN, CWS, Kim RN, BSN Entered By: Baltazar Najjar on 12/04/2018 07:42:32 Lee Jackson ([id number]) -------------------------------------------------------------------------------- Multi-Disciplinary Care Plan Details Patient Name: Lee Jackson Date of Service: 12/02/2018 2:15 PM Medical Record Number: [id number] Patient  Account Number: [id number] Date of Birth/Sex: 1980-12-03 (38 y.o. M) Treating RN: Huel Coventry Primary Care Jaquanna Ballentine: Jacquelin Hawking Other Clinician: Referring Byard Carranza: Jacquelin Hawking Treating Amelia Macken/Extender: Altamese [place] in Treatment: 1 Active Inactive Electronic Signature(s) Signed: 12/02/2018 5:23:21 PM By: Elliot Gurney, BSN, RN, CWS, Kim RN, BSN Entered By: Elliot Gurney, BSN, RN, CWS, Kim on 12/02/2018 14:58:57 Lee Jackson ([id number]) -------------------------------------------------------------------------------- Pain Assessment Details Patient Name: Lee Jackson Date of Service: 12/02/2018 2:15 PM Medical Record Number: [id number] Patient Account Number: [id number] Date of Birth/Sex: [birth year] (38 y.o. M) Treating RN: Rodell Perna Primary Care Eleonore Shippee: Jacquelin Hawking Other Clinician: Referring Cydnee Fuquay: Jacquelin Hawking Treating Jeanmarie Mccowen/Extender: Altamese [place] in Treatment: 1 Active Problems Location of Pain Severity and Description of Pain Patient Has Paino Yes Site Locations Pain Location: Pain in Ulcers With Dressing Change: Yes Duration of the Pain. Constant / Intermittento Intermittent Rate the pain. Current Pain Level: 4 Character of Pain Describe the Pain: Aching Pain Management and Medication Current Pain Management: Electronic Signature(s) Signed: 12/02/2018 3:02:20 PM By: Rodell Perna Entered By: Rodell Perna on 12/02/2018 14:15:52 [place], Crist ([id number]) -------------------------------------------------------------------------------- Wound Assessment Details Patient Name: Lee Jackson Date of Service: 12/02/2018 2:15 PM Medical Record Number: [id number] Patient Account Number: [id number] Date of Birth/Sex: [birth year] (38 y.o. M) Treating RN: Huel Coventry Primary Care Amarilis Belflower: Jacquelin Hawking Other Clinician: Referring Zaviyar Rahal: Jacquelin Hawking Treating Franke Menter/Extender: Altamese [place] in Treatment: 1 Wound Status Wound Number:  1 Primary Etiology: Venous Leg Ulcer Wound Location: Right, Proximal, Anterior Lower Leg Wound Status: Healed - Epithelialized Wounding Event: Blister Comorbid History: Asthma Date Acquired: 11/02/2018 Weeks Of Treatment: 1 Clustered Wound: No Photos Photo Uploaded By: Rodell Perna on 12/02/2018 15:01:15 Wound Measurements Length: (cm) 0 % Red Width: (cm) 0 % Red Depth: (cm) 0 Epith Area: (cm) 0 Tunn Volume: (cm) 0 Unde uction in Area: 100% uction in Volume: 100% elialization: Small (1-33%) eling: No rmining: No Wound Description Full Thickness Without Exposed Support Foul Classification: Structures Slou Wound Margin: Flat and Intact Exudate None Present Amount: Odor After Cleansing: No gh/Fibrino Yes Wound Bed Granulation Amount: Medium (34-66%) Exposed Structure Granulation Quality: Red Fascia Exposed: No Necrotic Amount: None Present (0%) Fat Layer (Subcutaneous Tissue) Exposed: Yes Tendon Exposed: No Muscle Exposed: No Joint Exposed: No Bone Exposed: No Periwound Skin Texture Texture Color Cumpston, Reeves ([id number]) No Abnormalities Noted: No No Abnormalities  Noted: No Callus: No Atrophie Blanche: No Crepitus: No Cyanosis: No Excoriation: No Ecchymosis: No Induration: No Erythema: No Rash: No Hemosiderin Staining: Yes Scarring: Yes Mottled: No Pallor: No Moisture Rubor: No No Abnormalities Noted: No Dry / Scaly: Yes Temperature / Pain Maceration: No Temperature: No Abnormality Tenderness on Palpation: Yes Wound Preparation Ulcer Cleansing: Rinsed/Irrigated with Saline Topical Anesthetic Applied: None Electronic Signature(s) Signed: 12/02/2018 5:23:21 PM By: Elliot Gurney, BSN, RN, CWS, Kim RN, BSN Entered By: Elliot Gurney, BSN, RN, CWS, Kim on 12/02/2018 14:57:43 Poynor, Barbara Cower ([id number]) -------------------------------------------------------------------------------- Wound Assessment Details Patient Name: Lee Jackson Date of Service: 12/02/2018 2:15  PM Medical Record Number: [id number] Patient Account Number: [id number] Date of Birth/Sex: June 25, 1981 (38 y.o. M) Treating RN: Huel Coventry Primary Care Kasaundra Fahrney: Jacquelin Hawking Other Clinician: Referring Nylen Creque: Jacquelin Hawking Treating Saim Almanza/Extender: Maxwell Caul We[place] in Treatment: 1 Wound Status Wound Number: 2 Primary Etiology: Venous Leg Ulcer Wound Location: Right, Anterior Lower Leg Wound Status: Healed - Epithelialized Wounding Event: Blister Comorbid History: Asthma Date Acquired: 11/02/2018 Weeks Of Treatment: 1 Clustered Wound: No Photos Photo Uploaded By: Rodell Perna on 12/02/2018 15:01:16 Wound Measurements Length: (cm) 0 Width: (cm) 0 Depth: (cm) 0 Area: (cm) 0 Volume: (cm) 0 % Reduction in Area: 100% % Reduction in Volume: 100% Epithelialization: Small (1-33%) Tunneling: No Undermining: No Wound Description Full Thickness Without Exposed Support Foul O Classification: Structures Slough Wound Margin: Flat and Intact Exudate None Present Amount: dor After Cleansing: No /Fibrino Yes Wound Bed Granulation Amount: Medium (34-66%) Exposed Structure Granulation Quality: Red Fascia Exposed: No Necrotic Amount: None Present (0%) Fat Layer (Subcutaneous Tissue) Exposed: Yes Tendon Exposed: No Muscle Exposed: No Joint Exposed: No Bone Exposed: No Periwound Skin Texture Texture Color Morrical, Naser ([id number]) No Abnormalities Noted: No No Abnormalities Noted: No Callus: No Atrophie Blanche: No Crepitus: No Cyanosis: No Excoriation: No Ecchymosis: No Induration: No Erythema: No Rash: No Hemosiderin Staining: Yes Scarring: Yes Mottled: No Pallor: No Moisture Rubor: No No Abnormalities Noted: No Dry / Scaly: Yes Temperature / Pain Maceration: No Temperature: No Abnormality Wound Preparation Ulcer Cleansing: Rinsed/Irrigated with Saline Topical Anesthetic Applied: Other: lidocaine 4%, Electronic Signature(s) Signed: 12/02/2018  5:23:21 PM By: Elliot Gurney, BSN, RN, CWS, Kim RN, BSN Entered By: Elliot Gurney, BSN, RN, CWS, Kim on 12/02/2018 14:57:43 Hyle, Barbara Cower ([id number]) -------------------------------------------------------------------------------- Wound Assessment Details Patient Name: Lee Jackson Date of Service: 12/02/2018 2:15 PM Medical Record Number: [id number] Patient Account Number: [id number] Date of Birth/Sex: 06-25-1981 (38 y.o. M) Treating RN: Huel Coventry Primary Care Marjorie Deprey: Jacquelin Hawking Other Clinician: Referring Kailie Polus: Jacquelin Hawking Treating Raequon Catanzaro/Extender: Altamese [place] in Treatment: 1 Wound Status Wound Number: 3 Primary Etiology: Venous Leg Ulcer Wound Location: Right, Distal, Anterior Lower Leg Wound Status: Healed - Epithelialized Wounding Event: Blister Comorbid History: Asthma Date Acquired: 11/02/2018 Weeks Of Treatment: 1 Clustered Wound: No Photos Photo Uploaded By: Rodell Perna on 12/02/2018 15:01:51 Wound Measurements Length: (cm) 0 % R Width: (cm) 0 % R Depth: (cm) 0 Epi Area: (cm) 0 Tu Volume: (cm) 0 Un eduction in Area: 100% eduction in Volume: 100% thelialization: None nneling: No dermining: No Wound Description Full Thickness Without Exposed Support Classification: Structures Wound Margin: Flat and Intact Exudate None Present Amount: Foul Odor After Cleansing: No Slough/Fibrino Yes Wound Bed Granulation Amount: Medium (34-66%) Exposed Structure Granulation Quality: Red Fascia Exposed: No Necrotic Amount: Medium (34-66%) Fat Layer (Subcutaneous Tissue) Exposed: Yes Necrotic Quality: Eschar, Adherent Slough Tendon Exposed: No Muscle Exposed: No Joint Exposed: No Bone Exposed: No Periwound Skin Texture  Texture Color Cassatt, Rooney ([id number]) No Abnormalities Noted: No No Abnormalities Noted: No Callus: No Atrophie Blanche: No Crepitus: No Cyanosis: No Excoriation: No Ecchymosis: No Induration: No Erythema: No Rash: No Hemosiderin  Staining: Yes Scarring: Yes Mottled: No Pallor: No Moisture Rubor: No No Abnormalities Noted: No Dry / Scaly: Yes Temperature / Pain Maceration: No Temperature: No Abnormality Wound Preparation Ulcer Cleansing: Rinsed/Irrigated with Saline Topical Anesthetic Applied: Other: lidocaine 4%, Electronic Signature(s) Signed: 12/02/2018 5:23:21 PM By: Elliot Gurney, BSN, RN, CWS, Kim RN, BSN Entered By: Elliot Gurney, BSN, RN, CWS, Kim on 12/02/2018 14:57:43 Bardin, Barbara Cower ([id number]) -------------------------------------------------------------------------------- Vitals Details Patient Name: Lee Jackson Date of Service: 12/02/2018 2:15 PM Medical Record Number: [id number] Patient Account Number: [id number] Date of Birth/Sex: 01-14-1981 (38 y.o. M) Treating RN: Rodell Perna Primary Care Laurine Kuyper: Jacquelin Hawking Other Clinician: Referring Mylene Bow: Jacquelin Hawking Treating Macayla Ekdahl/Extender: Altamese [place] in Treatment: 1 Vital Signs Time Taken: 14:15 Temperature (F): 98.3 Height (in): 70 Pulse (bpm): 86 Weight (lbs): 250 Respiratory Rate (breaths/min): 16 Body Mass Index (BMI): 35.9 Blood Pressure (mmHg): 151/90 Reference Range: 80 - 120 mg / dl Electronic Signature(s) Signed: 12/02/2018 3:02:20 PM By: Rodell Perna Entered By: Rodell Perna on 12/02/2018 14:16:07

## 2018-12-07 ENCOUNTER — Other Ambulatory Visit: Payer: Self-pay

## 2018-12-07 ENCOUNTER — Ambulatory Visit: Payer: Medicaid Other | Admitting: Physician Assistant

## 2018-12-07 ENCOUNTER — Encounter: Payer: Self-pay | Admitting: Physician Assistant

## 2018-12-07 VITALS — BP 136/94 | HR 95 | Temp 98.1°F

## 2018-12-07 DIAGNOSIS — E039 Hypothyroidism, unspecified: Secondary | ICD-10-CM | POA: Insufficient documentation

## 2018-12-07 DIAGNOSIS — Z7901 Long term (current) use of anticoagulants: Secondary | ICD-10-CM | POA: Insufficient documentation

## 2018-12-07 DIAGNOSIS — Z86711 Personal history of pulmonary embolism: Secondary | ICD-10-CM | POA: Insufficient documentation

## 2018-12-07 DIAGNOSIS — F39 Unspecified mood [affective] disorder: Secondary | ICD-10-CM

## 2018-12-07 DIAGNOSIS — E785 Hyperlipidemia, unspecified: Secondary | ICD-10-CM

## 2018-12-07 DIAGNOSIS — L97919 Non-pressure chronic ulcer of unspecified part of right lower leg with unspecified severity: Secondary | ICD-10-CM

## 2018-12-07 MED ORDER — ALBUTEROL SULFATE HFA 108 (90 BASE) MCG/ACT IN AERS: 1.0000 | INHALATION_SPRAY | RESPIRATORY_TRACT | 0 refills | Status: DC | PRN

## 2018-12-07 NOTE — Progress Notes (Signed)
BP (!) 136/94 (BP Location: Left Arm, Patient Position: Sitting, Cuff Size: Normal)   Pulse 95   Temp 98.1 F (36.7 C)   SpO2 98%    Subjective:    Patient ID: Lee Jackson, male    DOB: 1981/07/02, 38 y.o.   MRN: [id number]  HPI: Lee Jackson is a 38 y.o. male presenting on 12/07/2018 for Follow-up   HPI   Pt got his meds from [facility].   Pt is continuing with [facility] for mental health issues  Pt has been to wound clinic twice since his last OV here.   He says he was told to call back if they busted open which they did but he hasn't called back as instructed.   Relevant past medical, surgical, family and social history reviewed and updated as indicated. Interim medical history since our last visit reviewed. Allergies and medications reviewed and updated.   Current Outpatient Medications:  .  albuterol (ACCUNEB) 0.63 MG/3ML nebulizer solution, Take 1 ampule by nebulization every 6 (six) hours as needed for wheezing., Disp: , Rfl:  .  albuterol (PROVENTIL HFA;VENTOLIN HFA) 108 (90 Base) MCG/ACT inhaler, Inhale 1-2 puffs into the lungs every 4 (four) hours as needed for wheezing or shortness of breath., Disp: 1 Inhaler, Rfl: 0 .  amitriptyline (ELAVIL) 75 MG tablet, Take 75 mg by mouth at bedtime., Disp: , Rfl:  .  apixaban (ELIQUIS) 5 MG TABS tablet, Take 5 mg by mouth 2 (two) times daily., Disp: , Rfl:  .  citalopram (CELEXA) 20 MG tablet, Take 20 mg by mouth daily., Disp: , Rfl:  .  cyclobenzaprine (FLEXERIL) 10 MG tablet, Take 1 tablet (10 mg total) by mouth 3 (three) times daily as needed., Disp: 21 tablet, Rfl: 0 .  DULoxetine (CYMBALTA) 30 MG capsule, Take 30 mg by mouth 2 (two) times daily., Disp: , Rfl:  .  ibuprofen (ADVIL,MOTRIN) 600 MG tablet, Take 600 mg by mouth every 6 (six) hours as needed., Disp: , Rfl:  .  levothyroxine (SYNTHROID, LEVOTHROID) 150 MCG tablet, Take 1 tablet (150 mcg total) by mouth daily., Disp: 90 tablet, Rfl: 1    Review of Systems   Constitutional: Positive for appetite change and unexpected weight change. Negative for chills, diaphoresis, fatigue and fever.  HENT: Positive for sneezing. Negative for congestion, dental problem, drooling, ear pain, facial swelling, hearing loss, mouth sores, sore throat, trouble swallowing and voice change.   Eyes: Positive for pain and itching. Negative for discharge, redness and visual disturbance.  Respiratory: Positive for cough, shortness of breath and wheezing. Negative for choking.   Cardiovascular: Positive for leg swelling. Negative for chest pain and palpitations.  Gastrointestinal: Positive for abdominal pain. Negative for blood in stool, constipation, diarrhea and vomiting.  Endocrine: Negative for cold intolerance, heat intolerance and polydipsia.  Genitourinary: Negative for decreased urine volume, dysuria and hematuria.  Musculoskeletal: Positive for arthralgias, back pain and gait problem.  Skin: Negative for rash.  Allergic/Immunologic: Positive for environmental allergies.  Neurological: Positive for light-headedness and headaches. Negative for seizures and syncope.  Hematological: Negative for adenopathy.  Psychiatric/Behavioral: Positive for agitation and dysphoric mood. Negative for suicidal ideas. The patient is nervous/anxious.     Per HPI unless specifically indicated above     Objective:    BP (!) 136/94 (BP Location: Left Arm, Patient Position: Sitting, Cuff Size: Normal)   Pulse 95   Temp 98.1 F (36.7 C)   SpO2 98%   Wt Readings from Last 3 Encounters:  11/09/18 260 lb (117.9 kg)  10/30/18 245 lb (111.1 kg)  10/22/18 245 lb (111.1 kg)    Physical Exam Vitals signs reviewed.  Constitutional:      Appearance: He is well-developed.  HENT:     Head: Normocephalic and atraumatic.  Neck:     Musculoskeletal: Neck supple.  Cardiovascular:     Rate and Rhythm: Normal rate and regular rhythm.  Pulmonary:     Effort: Pulmonary effort is normal.      Breath sounds: Normal breath sounds. No wheezing.  Abdominal:     General: Bowel sounds are normal.     Palpations: Abdomen is soft.     Tenderness: There is no abdominal tenderness.  Lymphadenopathy:     Cervical: No cervical adenopathy.  Skin:    General: Skin is warm and dry.  Neurological:     Mental Status: He is alert and oriented to person, place, and time.  Psychiatric:        Behavior: Behavior normal.     Results for orders placed or performed during the hospital encounter of 11/10/18  Hemoglobin A1c  Result Value Ref Range   Hgb A1c MFr Bld 5.4 4.8 - 5.6 %   Mean Plasma Glucose 108 mg/dL  Lipid panel  Result Value Ref Range   Cholesterol 195 0 - 200 mg/dL   Triglycerides 007 (H) <150 mg/dL   HDL 33 (L) >62 mg/dL   Total CHOL/HDL Ratio 5.9 RATIO   VLDL 35 0 - 40 mg/dL   LDL Cholesterol 263 (H) 0 - 99 mg/dL      Assessment & Plan:    Encounter Diagnoses  Name Primary?  . Hyperlipidemia, unspecified hyperlipidemia type Yes  . Mood disorder (HCC)   . Ulcer of right lower extremity, unspecified ulcer stage (HCC)   . Anticoagulated   . Hypothyroidism, unspecified type     -reviewed labs with pt -Pt counseled on lowfat diet -Pt encouraged to call wound clinic for follow up as they instructed -pt to Continue with [facility] for mental health issues -pt requests form to be signed so he can get food stamps.  Form not signed as there is no reason why he cant work -pt to follow up in 3 months to recheck thyroid.  RTO sooner prn

## 2018-12-17 NOTE — Progress Notes (Signed)
Lee Jackson, Lee Jackson ([id number]) Visit Report for 12/02/2018 HPI Details Patient Name: Lee Jackson, Lee Jackson Date of Service: 12/02/2018 2:15 PM Medical Record Number: [id number] Patient Account Number: [id number] Date of Birth/Sex: [birth year] (38 y.o. M) Treating Jackson: Huel Coventry Primary Care Provider: Jacquelin Hawking Other Clinician: Referring Provider: Jacquelin Hawking Treating Provider/Extender: Altamese [place] in Treatment: 1 History of Present Illness HPI Description: Admission 11/25/2018 This is a 38 year old man. exsmoker. I think he was referred here after presenting to the ER at [facility] having bilateral lower extremity ulcers on 10/22/2018. He had a negative duplex ultrasound for DVT in the right leg. It was noted that he had several wound areas including healed areas that are hyperpigmented. His lab work on the ER visit date included a comprehensive metabolic panel that was normal a CBC that was normal. His hemoglobin A1c was 5.4. He tells me that his problem began 2 years ago. He had a cervical disc fusion while he was hospitalized in [facility] He started to develop blisters on his left leg. These were eventually rupture on their own and then heal over. He apparently was in hospital at one point with cellulitis of his legs about a year ago and at that time was seen by wound care doctor and infectious disease. He had biopsies of these areas bilaterally. We do not have these results but the patient thinks that this showed only "inflammation". More recently he has had more wounds on his right leg. Some of these start with blisters or some just start with a burning sensation the results and skin breakdown. Currently he has 3 open areas on his right anterior pre-tibia. The longest is been there for about 4 or 5 months. These are dry nondescript wounds. They have no depth. Also interestingly he has some subdermal purpuric-looking areas that almost look like red a form  purpura. I note that he has had ER visits for hematuria. Past medical history; the patient apparently had pulmonary emboli but was never proven to have lower extremity venous thrombosis. Hypothyroidism, low back pain, hypertension, hyperlipidemia, depression with anxiety, asthma, ankle surgery, cervical fusion. Socially; he is not employed has no Aeronautical engineer which will make dermatology consultation challenging. ABIs in our clinic were noncompressible bilaterally at 1.39 and 1.33 3/11; the patient arrived in clinic today and all the worrisome areas on the right leg from last time have closed over. His edema is well controlled. He tried to make an appointment with dermatology at [facility] but once again they asked for an upfront co-pay which he does not have the resources to provide. This is the same issue with [facility] dermatology locally. We [facility] [place]. This was largely secondary to an admission on 02/11/2018. He was signed in and is having recurrent lower extremity cellulitis. He had been admitted 1 month prior again with cellulitis. During the course of the hospitalization the issue of recurrent blistering leg wounds was also fairly completely addressed. He saw infectious disease who agrees that this was not infectious in antibiotics were stopped. A biopsy was not suggestive of vasculitis rather felt to be a drug reaction or an immune bullous process. He had antineutrophil cytoplasmic antibodies cardiolipin and B antibodies lupus anticoagulant ANA rheumatoid factor C4 and C3 levels as well as quantitative cryoglobulins. He did have properly done biopsies for bullous disease. He was seen by dermatology. This included periwound immunofluorescence and immunofixation electrophoresis. Factor V Leiden and prothrombin mutations were not  detected. With regards to immunofixation electrophoresis no specific amino reactants were detected. As mentioned  this was properly done by my review of his records Socially; the patient lives in [place] he is apparently trying to make application for disability. He does not have the resources for a co-pay to see dermatology Lee Jackson, Lee Jackson ([id number]) Electronic Signature(s) Signed: 12/16/2018 5:18:51 PM By: Baltazar Najjar MD Entered By: Baltazar Najjar on 12/04/2018 07:50:16 Lee Jackson, Lee Jackson ([id number]) -------------------------------------------------------------------------------- Physical Exam Details Patient Name: Lee Jackson Date of Service: 12/02/2018 2:15 PM Medical Record Number: [id number] Patient Account Number: [id number] Date of Birth/Sex: 23-Nov-1980 (38 y.o. M) Treating Jackson: Huel Coventry Primary Care Provider: Jacquelin Hawking Other Clinician: Referring Provider: Jacquelin Hawking Treating Provider/Extender: Altamese [place] in Treatment: 1 Constitutional Patient is hypertensive.. Pulse regular and within target range for patient.Marland Kitchen Respirations regular, non-labored and within target range.. Temperature is normal and within the target range for the patient.Marland Kitchen appears in no distress. Eyes Conjunctivae clear. No discharge. Respiratory Respiratory effort is easy and symmetric bilaterally. Rate is normal at rest and on room air.. Cardiovascular Pedal pulses palpable and strong bilaterally.. Lymphatic None palpable in the popliteal area bilateral. Integumentary (Hair, Skin) No blistering skin diseases are seen. Evidence of chronic venous insufficiency. Notes Wound exam; there are 3 wounds on his right leg from last week are closed. I see no surrounding blisters. No obvious cutaneous issues although I did not do a full skin check entirely there is certainly nothing on his legs. He has evidence of chronic venous insufficiency Electronic Signature(s) Signed: 12/16/2018 5:18:51 PM By: Baltazar Najjar MD Entered By: Baltazar Najjar on 12/04/2018 07:51:38 Lee Jackson, Lee Jackson  ([id number]) -------------------------------------------------------------------------------- Physician Orders Details Patient Name: Lee Jackson Date of Service: 12/02/2018 2:15 PM Medical Record Number: [id number] Patient Account Number: [id number] Date of Birth/Sex: 07-Mar-1981 (38 y.o. M) Treating Jackson: Huel Coventry Primary Care Provider: Jacquelin Hawking Other Clinician: Referring Provider: Jacquelin Hawking Treating Provider/Extender: Altamese [place] in Treatment: 1 Verbal / Phone Orders: No Diagnosis Coding Discharge From [facility] Services o Discharge from Wound Care Center - Treatment complete Electronic Signature(s) Signed: 12/02/2018 5:23:21 PM By: Elliot Gurney, BSN, Jackson, CWS, Lee Jackson, Lee Jackson Signed: 12/16/2018 5:18:51 PM By: Baltazar Najjar MD Entered By: Elliot Gurney, BSN, Jackson, CWS, Lee on 12/02/2018 15:01:26 Lee Jackson ([id number]) -------------------------------------------------------------------------------- Problem List Details Patient Name: Lee Jackson Date of Service: 12/02/2018 2:15 PM Medical Record Number: [id number] Patient Account Number: [id number] Date of Birth/Sex: November 02, 1980 (38 y.o. M) Treating Jackson: Huel Coventry Primary Care Provider: Jacquelin Hawking Other Clinician: Referring Provider: Jacquelin Hawking Treating Provider/Extender: Altamese [place] in Treatment: 1 Active Problems ICD-10 Evaluated Encounter Code Description Active Date Today Diagnosis L97.811 Non-pressure chronic ulcer of other part of right lower leg 11/25/2018 No Yes limited to breakdown of skin I87.323 Chronic venous hypertension (idiopathic) with inflammation of 11/25/2018 No Yes bilateral lower extremity Inactive Problems Resolved Problems Electronic Signature(s) Signed: 12/16/2018 5:18:51 PM By: Baltazar Najjar MD Entered By: Baltazar Najjar on 12/04/2018 07:42:23 Lienhard, Lee Jackson ([id number]) -------------------------------------------------------------------------------- Progress Note Details Patient  Name: Lee Jackson Date of Service: 12/02/2018 2:15 PM Medical Record Number: [id number] Patient Account Number: [id number] Date of Birth/Sex: 1980/12/16 (38 y.o. M) Treating Jackson: Huel Coventry Primary Care Provider: Jacquelin Hawking Other Clinician: Referring Provider: Jacquelin Hawking Treating Provider/Extender: Altamese  in Treatment: 1 Subjective History of Present Illness (HPI) Admission 11/25/2018 This is a 38 year old man. exsmoker. I think he was referred here after presenting to the ER at [facility] having bilateral lower extremity ulcers  on 10/22/2018. He had a negative duplex ultrasound for DVT in the right leg. It was noted that he had several wound areas including healed areas that are hyperpigmented. His lab work on the ER visit date included a comprehensive metabolic panel that was normal a CBC that was normal. His hemoglobin A1c was 5.4. He tells me that his problem began 2 years ago. He had a cervical disc fusion while he was hospitalized in [facility]. He started to develop blisters on his left leg. These were eventually rupture on their own and then heal over. He apparently was in hospital at one point with cellulitis of his legs about a year ago and at that time was seen by wound care doctor and infectious disease. He had biopsies of these areas bilaterally. We do not have these results but the patient thinks that this showed only "inflammation". More recently he has had more wounds on his right leg. Some of these start with blisters or some just start with a burning sensation the results and skin breakdown. Currently he has 3 open areas on his right anterior pre-tibia. The longest is been there for about 4 or 5 months. These are dry nondescript wounds. They have no depth. Also interestingly he has some subdermal purpuric-looking areas that almost look like red a form purpura. I note that he has had ER visits for hematuria. Past medical history;  the patient apparently had pulmonary emboli but was never proven to have lower extremity venous thrombosis. Hypothyroidism, low back pain, hypertension, hyperlipidemia, depression with anxiety, asthma, ankle surgery, cervical fusion. Socially; he is not employed has no Aeronautical engineer which will make dermatology consultation challenging. ABIs in our clinic were noncompressible bilaterally at 1.39 and 1.33 3/11; the patient arrived in clinic today and all the worrisome areas on the right leg from last time have closed over. His edema is well controlled. He tried to make an appointment with dermatology at [facility] but once again they asked for an upfront co-pay which he does not have the resources to provide. This is the same issue with [facility] dermatology locally. We did [facility] extensive [facility] [place]. This was largely secondary to an admission on 02/11/2018. He was signed in and is having recurrent lower extremity cellulitis. He had been admitted 1 month prior again with cellulitis. During the course of the hospitalization the issue of recurrent blistering leg wounds was also fairly completely addressed. He saw infectious disease who agrees that this was not infectious in antibiotics were stopped. A biopsy was not suggestive of vasculitis rather felt to be a drug reaction or an immune bullous process. He had antineutrophil cytoplasmic antibodies cardiolipin and B antibodies lupus anticoagulant ANA rheumatoid factor C4 and C3 levels as well as quantitative cryoglobulins. He did have properly done biopsies for bullous disease. He was seen by dermatology. This included periwound immunofluorescence and immunofixation electrophoresis. Factor V Leiden and prothrombin mutations were not detected. With regards to immunofixation electrophoresis no specific amino reactants were detected. As mentioned this was properly done by my review of his records Socially; the patient lives in  [place] he is apparently trying to make application for disability. He does not have the resources for a co-pay to see dermatology Lee Jackson, Lee Jackson ([id number]) Objective Constitutional Patient is hypertensive.. Pulse regular and within target range for patient.Marland Kitchen Respirations regular, non-labored and within target range.. Temperature is normal and within the target range for the patient.Marland Kitchen appears in no distress. Vitals Time  Taken: 2:15 PM, Height: 70 in, Weight: 250 lbs, BMI: 35.9, Temperature: 98.3 F, Pulse: 86 bpm, Respiratory Rate: 16 breaths/min, Blood Pressure: 151/90 mmHg. Eyes Conjunctivae clear. No discharge. Respiratory Respiratory effort is easy and symmetric bilaterally. Rate is normal at rest and on room air.. Cardiovascular Pedal pulses palpable and strong bilaterally.. Lymphatic None palpable in the popliteal area bilateral. General Notes: Wound exam; there are 3 wounds on his right leg from last week are closed. I see no surrounding blisters. No obvious cutaneous issues although I did not do a full skin check entirely there is certainly nothing on his legs. He has evidence of chronic venous insufficiency Integumentary (Hair, Skin) No blistering skin diseases are seen. Evidence of chronic venous insufficiency. Wound #1 status is Healed - Epithelialized. Original cause of wound was Blister. The wound is located on the Right,Proximal,Anterior Lower Leg. The wound measures 0cm length x 0cm width x 0cm depth; 0cm^2 area and 0cm^3 volume. There is Fat Layer (Subcutaneous Tissue) Exposed exposed. There is no tunneling or undermining noted. There is a none present amount of drainage noted. The wound margin is flat and intact. There is medium (34-66%) red granulation within the wound bed. There is no necrotic tissue within the wound bed. The periwound skin appearance exhibited: Scarring, Dry/Scaly, Hemosiderin Staining. The periwound skin appearance did not exhibit: Callus, Crepitus,  Excoriation, Induration, Rash, Maceration, Atrophie Blanche, Cyanosis, Ecchymosis, Mottled, Pallor, Rubor, Erythema. Periwound temperature was noted as No Abnormality. The periwound has tenderness on palpation. Wound #2 status is Healed - Epithelialized. Original cause of wound was Blister. The wound is located on the Right,Anterior Lower Leg. The wound measures 0cm length x 0cm width x 0cm depth; 0cm^2 area and 0cm^3 volume. There is Fat Layer (Subcutaneous Tissue) Exposed exposed. There is no tunneling or undermining noted. There is a none present amount of drainage noted. The wound margin is flat and intact. There is medium (34-66%) red granulation within the wound bed. There is no necrotic tissue within the wound bed. The periwound skin appearance exhibited: Scarring, Dry/Scaly, Hemosiderin Staining. The periwound skin appearance did not exhibit: Callus, Crepitus, Excoriation, Induration, Rash, Maceration, Atrophie Blanche, Cyanosis, Ecchymosis, Mottled, Pallor, Rubor, Erythema. Periwound temperature was noted as No Abnormality. Wound #3 status is Healed - Epithelialized. Original cause of wound was Blister. The wound is located on the Right,Distal,Anterior Lower Leg. The wound measures 0cm length x 0cm width x 0cm depth; 0cm^2 area and 0cm^3 volume. There is Fat Layer (Subcutaneous Tissue) Exposed exposed. There is no tunneling or undermining noted. There is a none present amount of drainage noted. The wound margin is flat and intact. There is medium (34-66%) red granulation within the wound bed. There is a medium (34-66%) amount of necrotic tissue within the wound bed including Eschar and Adherent Slough. The periwound skin appearance exhibited: Scarring, Dry/Scaly, Hemosiderin Staining. The periwound skin Lee Jackson, Lee Jackson ([id number]) appearance did not exhibit: Callus, Crepitus, Excoriation, Induration, Rash, Maceration, Atrophie Blanche, Cyanosis, Ecchymosis, Mottled, Pallor, Rubor, Erythema.  Periwound temperature was noted as No Abnormality. 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 I think the patient can be discharged from the wound center 2. We gave him Tubigrip stockings that he can wear on his right leg. After these become unbearable than simply support stockings that he can  get at any store are reasonable for now. 3. The patient was reasonably completely worked up for the bullous skin diseases including biopsies and all relevant blood work during an admission to hospital in May of this year as noted in the HPI. An exact diagnosis was not made. It would seem reasonable at this point that if he develops blisters further he would need another biopsy attempt which would include periwound tissue for immunofixation electrophoresis. We are not set up in wound care clinics to do this Electronic Signature(s) Signed: 12/16/2018 5:18:51 PM By: Baltazar Najjar MD Entered By: Baltazar Najjar on 12/04/2018 07:53:20 Lee Jackson, Lee Jackson ([id number]) -------------------------------------------------------------------------------- SuperBill Details Patient Name: Lee Jackson Date of Service: 12/02/2018 Medical Record Number: [id number] Patient Account Number: [id number] Date of Birth/Sex: Feb 20, 1981 (38 y.o. M) Treating Jackson: Huel Coventry Primary Care Provider: Jacquelin Hawking Other Clinician: Referring Provider: Jacquelin Hawking Treating Provider/Extender: Altamese [place] in Treatment: 1 Diagnosis Coding ICD-10 Codes Code Description L97.811 Non-pressure chronic ulcer of other part of right lower leg limited to breakdown of skin I87.323 Chronic venous hypertension (idiopathic) with inflammation of bilateral lower extremity Facility  Procedures CPT4 Code: [id number] Description: [phone] - WOUND CARE VISIT-LEV 2 EST PT Modifier: Quantity: 1 Physician Procedures CPT4 Code Description: [id number] - WC PHYS LEVEL 3 - EST PT ICD-10 Diagnosis Description L97.811 Non-pressure chronic ulcer of other part of right lower leg lim I87.323 Chronic venous hypertension (idiopathic) with inflammation of b Modifier: ited to breakdow ilateral lower e Quantity: 1 n of skin xtremity Electronic Signature(s) Signed: 12/16/2018 5:18:51 PM By: Baltazar Najjar MD Entered By: Baltazar Najjar on 12/04/2018 07:53:32

## 2019-02-11 ENCOUNTER — Other Ambulatory Visit: Payer: Self-pay | Admitting: Physician Assistant

## 2019-02-11 DIAGNOSIS — E785 Hyperlipidemia, unspecified: Secondary | ICD-10-CM

## 2019-02-11 DIAGNOSIS — E039 Hypothyroidism, unspecified: Secondary | ICD-10-CM

## 2019-02-11 DIAGNOSIS — Z86711 Personal history of pulmonary embolism: Secondary | ICD-10-CM

## 2019-03-05 ENCOUNTER — Other Ambulatory Visit (HOSPITAL_COMMUNITY)
Admission: RE | Admit: 2019-03-05 | Discharge: 2019-03-05 | Disposition: A | Discharge status: Home / Self Care | Payer: Medicaid Other | Source: Ambulatory Visit | Attending: Physician Assistant | Admitting: Physician Assistant

## 2019-03-05 DIAGNOSIS — Z86711 Personal history of pulmonary embolism: Secondary | ICD-10-CM | POA: Insufficient documentation

## 2019-03-05 DIAGNOSIS — E785 Hyperlipidemia, unspecified: Secondary | ICD-10-CM

## 2019-03-05 DIAGNOSIS — E039 Hypothyroidism, unspecified: Secondary | ICD-10-CM | POA: Insufficient documentation

## 2019-03-05 LAB — LIPID PANEL
Cholesterol: 222 mg/dL — ABNORMAL HIGH (ref 0–200)
HDL: 37 mg/dL — ABNORMAL LOW (ref >40)
LDL Cholesterol: 152 mg/dL — ABNORMAL HIGH (ref 0–99)
Total CHOL/HDL Ratio: 6 RATIO
Triglycerides: 167 mg/dL — ABNORMAL HIGH (ref <150)
VLDL: 33 mg/dL (ref 0–40)

## 2019-03-05 LAB — COMPREHENSIVE METABOLIC PANEL
ALT: 29 U/L (ref 0–44)
AST: 16 U/L (ref 15–41)
Albumin: 4 g/dL (ref 3.5–5.0)
Alkaline Phosphatase: 52 U/L (ref 38–126)
Anion gap: 10 (ref 5–15)
BUN: 12 mg/dL (ref 6–20)
CO2: 24 mmol/L (ref 22–32)
Calcium: 8.6 mg/dL — ABNORMAL LOW (ref 8.9–10.3)
Chloride: 104 mmol/L (ref 98–111)
Creatinine, Ser: 1.07 mg/dL (ref 0.61–1.24)
GFR calc Af Amer: >60 mL/min (ref >60)
GFR calc non Af Amer: >60 mL/min (ref >60)
Glucose, Bld: 103 mg/dL — ABNORMAL HIGH (ref 70–99)
Potassium: 4.1 mmol/L (ref 3.5–5.1)
Sodium: 138 mmol/L (ref 135–145)
Total Bilirubin: 0.9 mg/dL (ref 0.3–1.2)
Total Protein: 7 g/dL (ref 6.5–8.1)

## 2019-03-05 LAB — TSH: TSH: 8.65 u[IU]/mL — ABNORMAL HIGH (ref 0.350–4.500)

## 2019-03-09 ENCOUNTER — Encounter: Payer: Self-pay | Admitting: Physician Assistant

## 2019-03-09 ENCOUNTER — Ambulatory Visit: Payer: Medicaid Other | Admitting: Physician Assistant

## 2019-03-09 DIAGNOSIS — Z7901 Long term (current) use of anticoagulants: Secondary | ICD-10-CM

## 2019-03-09 DIAGNOSIS — M545 Low back pain, unspecified: Secondary | ICD-10-CM

## 2019-03-09 DIAGNOSIS — M549 Dorsalgia, unspecified: Secondary | ICD-10-CM | POA: Insufficient documentation

## 2019-03-09 DIAGNOSIS — E039 Hypothyroidism, unspecified: Secondary | ICD-10-CM

## 2019-03-09 DIAGNOSIS — Z86711 Personal history of pulmonary embolism: Secondary | ICD-10-CM

## 2019-03-09 DIAGNOSIS — E785 Hyperlipidemia, unspecified: Secondary | ICD-10-CM | POA: Insufficient documentation

## 2019-03-09 MED ORDER — ATORVASTATIN CALCIUM 20 MG PO TABS: 20.0000 mg | ORAL_TABLET | Freq: Every day | ORAL | 1 refills | Status: AC

## 2019-03-09 MED ORDER — IBUPROFEN 600 MG PO TABS: 600.0000 mg | ORAL_TABLET | Freq: Three times a day (TID) | ORAL | 0 refills | Status: DC | PRN

## 2019-03-09 NOTE — Progress Notes (Signed)
LPN placed Release of Information Form for outgoing mail on 03-09-19. [facility] is requesting records from [facility] for pt's notes on pulmonary embolism (summer 2019). LPN highlighted where pt is to sign and date was was given [facility] address for him to mail back.

## 2019-03-09 NOTE — Progress Notes (Signed)
There were no vitals taken for this visit.   Subjective:    Patient ID: Lee Jackson, male    DOB: [birth year], 38 y.o.   MRN: [id number]  HPI: Lee Jackson is a 38 y.o. male presenting on 03/09/2019 for No chief complaint on file.   HPI   This is a telemedicine appointment due to coronavirus pandemic.  It is via telephone as pt does not have a smartphone with video capabilities  I connected with  Lee Jackson on 03/09/19 by a video enabled telemedicine application and verified that I am speaking with the correct person using two identifiers.   I discussed the limitations of evaluation and management by telemedicine. The patient expressed understanding and agreed to proceed.  Pt is at home.  Provider is at office  Pt says he fell last week in the kitchen and says his back is still hurting.  He says it is swollen.  He is using heat.   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   He says he had neck surgery recently   He is going to [facility] for [facility] health issues.  Pt says his legs ulcers are closed now.   He has no open weeping wounds on his lower extremities.   Pt says he had PE just one time- he was told the started in his legs but that they couldn't find them there.  This happened around last summer.  He was at the hospital in [place].   He has been on eliquis since that time.   Relevant past medical, surgical, family and social history reviewed and updated as indicated. Interim medical history since our last visit reviewed. Allergies and medications reviewed and updated.   Current Outpatient Medications:  .  albuterol (PROVENTIL HFA;VENTOLIN HFA) 108 (90 Base) MCG/ACT inhaler, Inhale 1-2 puffs into the lungs every 4 (four) hours as needed for wheezing or shortness of breath., Disp: 3 Inhaler, Rfl: 0 .  amitriptyline (ELAVIL) 75 MG tablet, Take 75 mg by mouth at bedtime., Disp: , Rfl:  .  apixaban (ELIQUIS) 5 MG TABS tablet, Take 5 mg by mouth 2  (two) times daily., Disp: , Rfl:  .  DULoxetine (CYMBALTA) 30 MG capsule, Take 30 mg by mouth 2 (two) times daily., Disp: , Rfl:  .  ibuprofen (ADVIL,MOTRIN) 600 MG tablet, Take 600 mg by mouth every 6 (six) hours as needed., Disp: , Rfl:  .  levothyroxine (SYNTHROID, LEVOTHROID) 150 MCG tablet, Take 1 tablet (150 mcg total) by mouth daily., Disp: 90 tablet, Rfl: 1    Review of Systems  Per HPI unless specifically indicated above     Objective:    There were no vitals taken for this visit.  Wt Readings from Last 3 Encounters:  11/09/18 260 lb (117.9 kg)  10/30/18 245 lb (111.1 kg)  10/22/18 245 lb (111.1 kg)    Physical Exam Pulmonary:     Effort: Pulmonary effort is normal. No respiratory distress.  Neurological:     Mental Status: He is alert and oriented to person, place, and time.  Psychiatric:        Attention and Perception: Attention normal.        Speech: Speech normal.        Behavior: Behavior is cooperative.        Cognition and Memory: Cognition normal.     Results for orders placed or performed during the hospital encounter of 03/05/19  Comprehensive metabolic panel  Result Value Ref Range   Sodium 138 135 - 145 mmol/L   Potassium 4.1 3.5 - 5.1 mmol/L   Chloride 104 98 - 111 mmol/L   CO2 24 22 - 32 mmol/L   Glucose, Bld 103 (H) 70 - 99 mg/dL   BUN 12 6 - 20 mg/dL   Creatinine, Ser 1.611.07 0.61 - 1.24 mg/dL   Calcium 8.6 (L) 8.9 - 10.3 mg/dL   Total Protein 7.0 6.5 - 8.1 g/dL   Albumin 4.0 3.5 - 5.0 g/dL   AST 16 15 - 41 U/L   ALT 29 0 - 44 U/L   Alkaline Phosphatase 52 38 - 126 U/L   Total Bilirubin 0.9 0.3 - 1.2 mg/dL   GFR calc non Af Amer >60 >60 mL/min   GFR calc Af Amer >60 >60 mL/min   Anion gap 10 5 - 15  Lipid panel  Result Value Ref Range   Cholesterol 222 (H) 0 - 200 mg/dL   Triglycerides 096167 (H) <150 mg/dL   HDL 37 (L) >04>40 mg/dL   Total CHOL/HDL Ratio 6.0 RATIO   VLDL 33 0 - 40 mg/dL   LDL Cholesterol 540152 (H) 0 - 99 mg/dL  TSH  Result  Value Ref Range   TSH 8.650 (H) 0.350 - 4.500 uIU/mL      Assessment & Plan:    Encounter Diagnoses  Name Primary?  . Hypothyroidism, unspecified type Yes  . Anticoagulated   . History of pulmonary embolism   . Hyperlipidemia, unspecified hyperlipidemia type   . Low back pain without sciatica, unspecified back pain laterality, unspecified chronicity      -Reviewed labs with pt -will add  Atorvastatin and pt counseled on lowfat diet (medassist) -will Send record release to pt so [facility] can be [facility] [place] so it can be determined if eliquis can be discontinued -rx IBU (medassist) -encouraged pt to wear a mask when in public per CDC guideline -will follow up in 3months.  Pt to contact office sooner prn

## 2019-03-10 ENCOUNTER — Other Ambulatory Visit: Payer: Self-pay

## 2019-03-10 ENCOUNTER — Emergency Department (HOSPITAL_COMMUNITY)
Admission: EM | Admit: 2019-03-10 | Discharge: 2019-03-10 | Disposition: A | Discharge status: Home / Self Care | Payer: Self-pay | Attending: Emergency Medicine | Admitting: Emergency Medicine

## 2019-03-10 ENCOUNTER — Emergency Department (HOSPITAL_COMMUNITY): Payer: Self-pay

## 2019-03-10 ENCOUNTER — Encounter (HOSPITAL_COMMUNITY): Payer: Self-pay | Admitting: Emergency Medicine

## 2019-03-10 DIAGNOSIS — Y999 Unspecified external cause status: Secondary | ICD-10-CM | POA: Insufficient documentation

## 2019-03-10 DIAGNOSIS — Z91040 Latex allergy status: Secondary | ICD-10-CM | POA: Insufficient documentation

## 2019-03-10 DIAGNOSIS — Y929 Unspecified place or not applicable: Secondary | ICD-10-CM | POA: Insufficient documentation

## 2019-03-10 DIAGNOSIS — Z79899 Other long term (current) drug therapy: Secondary | ICD-10-CM | POA: Insufficient documentation

## 2019-03-10 DIAGNOSIS — Y939 Activity, unspecified: Secondary | ICD-10-CM | POA: Insufficient documentation

## 2019-03-10 DIAGNOSIS — M5136 Other intervertebral disc degeneration, lumbar region: Secondary | ICD-10-CM | POA: Insufficient documentation

## 2019-03-10 DIAGNOSIS — Z7901 Long term (current) use of anticoagulants: Secondary | ICD-10-CM | POA: Insufficient documentation

## 2019-03-10 DIAGNOSIS — Z87891 Personal history of nicotine dependence: Secondary | ICD-10-CM | POA: Insufficient documentation

## 2019-03-10 DIAGNOSIS — J45909 Unspecified asthma, uncomplicated: Secondary | ICD-10-CM | POA: Insufficient documentation

## 2019-03-10 DIAGNOSIS — W19XXXA Unspecified fall, initial encounter: Secondary | ICD-10-CM | POA: Insufficient documentation

## 2019-03-10 DIAGNOSIS — S39012A Strain of muscle, fascia and tendon of lower back, initial encounter: Secondary | ICD-10-CM | POA: Insufficient documentation

## 2019-03-10 MED ORDER — ONDANSETRON HCL 4 MG PO TABS
4.0000 mg | ORAL_TABLET | Freq: Once | ORAL | Status: AC
  Administered 2019-03-10: 4 mg via ORAL
  Filled 2019-03-10: qty 1

## 2019-03-10 MED ORDER — TRAMADOL HCL 50 MG PO TABS: 50.0000 mg | ORAL_TABLET | Freq: Four times a day (QID) | ORAL | 0 refills | Status: DC | PRN

## 2019-03-10 MED ORDER — CYCLOBENZAPRINE HCL 10 MG PO TABS
10.0000 mg | ORAL_TABLET | Freq: Once | ORAL | Status: AC
  Administered 2019-03-10: 15:00:00 10 mg via ORAL
  Filled 2019-03-10: qty 1

## 2019-03-10 MED ORDER — TRAMADOL HCL 50 MG PO TABS
100.0000 mg | ORAL_TABLET | Freq: Once | ORAL | Status: AC
  Administered 2019-03-10: 100 mg via ORAL
  Filled 2019-03-10: qty 2

## 2019-03-10 MED ORDER — CYCLOBENZAPRINE HCL 10 MG PO TABS: 10.0000 mg | ORAL_TABLET | Freq: Three times a day (TID) | ORAL | 0 refills | Status: DC

## 2019-03-10 NOTE — ED Provider Notes (Signed)
[facility] Provider Note   CSN: [id number] Arrival date & time: 03/10/19  1215     History   Chief Complaint Chief Complaint  Patient presents with  . Fall    HPI Lee CourierJason Jackson is a 38 y.o. male.     Patient is a [age] year old male who presents to the emergency department following a fall.  The patient states he fell about 4 days ago.  He injured his lower back.  The patient states that since that time he has been feeling jerking sensation in his lower extremities.  He says at times he also feels as though he has some numbness and tingling in his lower extremities.  The patient is concerned because he has had degenerative disc disease involving his cervical spine.  And he was unable to have anything done about it for nearly a year.  He says that when he did get something done that he was having neurologic deficits and some early paralysis.  He denies hitting his head.  He is not had problems with breathing.  He says he can walk, but it hurts his back when he walks.  He has tried Tylenol.  He says he has some Flexeril at home that he is tried, but his pain continues and at times it keeps him up at night.  The history is provided by the patient.    Past Medical History:  Diagnosis Date  . Anxiety   . Asthma   . Depression   . GERD (gastroesophageal reflux disease)   . History of degenerative disc disease   . Hx of gallstones   . Hypertension   . Mood swings   . PTSD (post-traumatic stress disorder)   . Pulmonary embolism (HCC) 02/2018   bilateral  . Thyroid disease     Patient Active Problem List   Diagnosis Date Noted  . Hyperlipidemia 03/09/2019  . Back pain 03/09/2019  . Hypothyroidism 12/07/2018  . Anticoagulated 12/07/2018  . History of pulmonary embolism 12/07/2018    Past Surgical History:  Procedure Laterality Date  . ADENOIDECTOMY Bilateral   . ANKLE SURGERY Right    teenage years  . CERVICAL FUSION    . ESOPHAGOSCOPY WITH DILITATION     . HERNIA REPAIR     childhood  . ROTATOR CUFF REPAIR Right 2015  . TONSILLECTOMY          Home Medications    Prior to Admission medications   Medication Sig Start Date End Date Taking? Authorizing Provider  albuterol (PROVENTIL HFA;VENTOLIN HFA) 108 (90 Base) MCG/ACT inhaler Inhale 1-2 puffs into the lungs every 4 (four) hours as needed for wheezing or shortness of breath. 12/07/18   Jacquelin HawkingMcElroy, Shannon, PA-C  amitriptyline (ELAVIL) 75 MG tablet Take 75 mg by mouth at bedtime.    [provider]  apixaban (ELIQUIS) 5 MG TABS tablet Take 5 mg by mouth 2 (two) times daily.    [provider]  atorvastatin (LIPITOR) 20 MG tablet Take 1 tablet (20 mg total) by mouth daily. 03/09/19   Jacquelin HawkingMcElroy, Shannon, PA-C  DULoxetine (CYMBALTA) 30 MG capsule Take 30 mg by mouth 2 (two) times daily.    [provider]  ibuprofen (ADVIL) 600 MG tablet Take 1 tablet (600 mg total) by mouth every 8 (eight) hours as needed. 03/09/19   Jacquelin HawkingMcElroy, Shannon, PA-C  levothyroxine (SYNTHROID, LEVOTHROID) 150 MCG tablet Take 1 tablet (150 mcg total) by mouth daily. 11/09/18   Jacquelin HawkingMcElroy, Shannon, PA-C    Family  History Family History  Problem Relation Age of Onset  . Cancer Mother   . Heart attack Mother     Social History Social History   Tobacco Use  . Smoking status: Former Smoker    Packs/day: 0.50    Years: 10.00    Pack years: 5.00    Types: Cigarettes    Quit date: 09/23/2012    Years since quitting: 6.4  . Smokeless tobacco: Never Used  Substance Use Topics  . Alcohol use: Not Currently    Comment: hx etoh. none since 2014  . Drug use: Not Currently    Comment: hx OD pain meds approx 2005     Allergies   Banana, Pickled meat, Latex, Mucinex [guaifenesin er], Penicillins, Prevacid [lansoprazole], Sudafed [pseudoephedrine hcl], and Vancomycin   Review of Systems Review of Systems  Constitutional: Negative for activity change.       All ROS Neg except as noted in HPI   HENT: Negative.   Eyes: Negative for photophobia and discharge.  Respiratory: Negative for cough, shortness of breath and wheezing.   Cardiovascular: Negative for chest pain and palpitations.  Gastrointestinal: Negative for abdominal pain and blood in stool.  Genitourinary: Negative for dysuria, frequency and hematuria.  Musculoskeletal: Positive for back pain. Negative for arthralgias and neck pain.  Skin: Negative.   Neurological: Negative for dizziness, seizures and speech difficulty.  Psychiatric/Behavioral: Negative for confusion and hallucinations.     Physical Exam Updated Vital Signs BP (!) 133/103 (BP Location: Left Arm)   Pulse 96   Temp 97.7 F (36.5 C) (Oral)   Resp 14   Ht 5\' 10"  (1.778 m)   Wt 117.9 kg   SpO2 98%   BMI 37.31 kg/m   Physical Exam Vitals signs and nursing note reviewed.  Constitutional:      Appearance: He is well-developed. He is not toxic-appearing.  HENT:     Head: Normocephalic.     Right Ear: Tympanic membrane and external ear normal.     Left Ear: Tympanic membrane and external ear normal.  Eyes:     General: Lids are normal.     Pupils: Pupils are equal, round, and reactive to light.  Neck:     Musculoskeletal: Normal range of motion and neck supple.     Vascular: No carotid bruit.  Cardiovascular:     Rate and Rhythm: Normal rate and regular rhythm.     Pulses: Normal pulses.     Heart sounds: Normal heart sounds.  Pulmonary:     Effort: No respiratory distress.     Breath sounds: Normal breath sounds.  Abdominal:     General: Bowel sounds are normal.     Palpations: Abdomen is soft.     Tenderness: There is no abdominal tenderness. There is no guarding.  Musculoskeletal:     Lumbar back: He exhibits decreased range of motion, pain and spasm.       Back:  Lymphadenopathy:     Head:     Right side of head: No submandibular adenopathy.     Left side of head: No submandibular adenopathy.     Cervical: No cervical  adenopathy.  Skin:    General: Skin is warm and dry.  Neurological:     Mental Status: He is alert and oriented to person, place, and time.     Cranial Nerves: No cranial nerve deficit.     Sensory: No sensory deficit.  Psychiatric:        Speech: Speech  normal.      ED Treatments / Results  Labs (all labs ordered are listed, but only abnormal results are displayed) Labs Reviewed - No data to display  EKG    Radiology Dg Lumbar Spine Complete  Result Date: 03/10/2019 CLINICAL DATA:  Status post fall. Radiates to the lower extremities. EXAM: LUMBAR SPINE - COMPLETE 4+ VIEW COMPARISON:  12/07/2016 FINDINGS: There are 5 nonrib bearing lumbar-type vertebral bodies. The vertebral body heights are maintained. There is no acute fracture. There is no static listhesis. There is no spondylolysis. There is degenerative disease with disc height loss at L4-5 with bilateral facet arthropathy. The SI joints are unremarkable. There are possible 2 small right nephrolithiasis. IMPRESSION: No acute osseous injury of the lumbar spine. Electronically Signed   By: Elige KoHetal  Patel   On: 03/10/2019 13:45    Procedures Procedures (including critical care time)  Medications Ordered in ED Medications  cyclobenzaprine (FLEXERIL) tablet 10 mg (has no administration in time range)  traMADol (ULTRAM) tablet 100 mg (has no administration in time range)  ondansetron (ZOFRAN) tablet 4 mg (has no administration in time range)     Initial Impression / Assessment and Plan / ED Course  I have reviewed the triage vital signs and the nursing notes.  Pertinent labs & imaging results that were available during my care of the patient were reviewed by me and considered in my medical decision making (see chart for details).          Final Clinical Impressions(s) / ED Diagnoses MDM  Vital signs reviewed.  Blood pressure elevated at 133/103.  The patient is hesitant to cooperate for the testing of strength of  the lower extremity because he says it hurts his back.  No sensory deficits appreciated.  No evidence of cauda equina or other emergent changes.  There is no palpable step-off of the lumbar spine area.  The patient's states that he is extremely concerned about possible surgical back issue.  He is requesting additional imaging, as the x-ray of the lumbar spine is negative for fracture or dislocation.  CT scan is negative for fracture.  There are lumbar disc and facet degeneration noted.  This is particularly noted at the L4-L5 area.  I have discussed these findings with the patient in terms which he understands.  Prescription for Flexeril and Ultram given to the patient.  The patient is to use Tylenol with each meal and at bedtime for additional coverage of his pain.   Final diagnoses:  Fall, initial encounter  Lumbar strain, initial encounter  DDD (degenerative disc disease), lumbar    ED Discharge Orders    None       Ivery QualeBryant, Henry Demeritt, PA-C 03/10/19 1528    Vanetta MuldersZackowski, Scott, MD 03/10/19 1659

## 2019-03-10 NOTE — ED Triage Notes (Signed)
Patient reports lower back pain after a fall on Saturday. Has hx of back problems.

## 2019-03-10 NOTE — Discharge Instructions (Addendum)
Your x-ray and the CT scan of your lumbar spine show multiple areas of arthritis.  There are also degenerative disc disease changes, but no acute surgical findings at this time.  Your examination shows multiple areas of spasm.  Please use Flexeril 3 times daily for spasm pain.  Please use Tylenol extra strength with breakfast, lunch, dinner, and at bedtime.  May use Ultram for more severe pain.  Flexeril and Ultram may cause drowsiness, and or lightheadedness.  Please use these medications with caution.  Please do not drive a vehicle, operate machinery, drink alcohol, or participate in activities requiring concentration when taking either these medications.  Heating pad to your lower back will be helpful.

## 2019-03-10 NOTE — ED Notes (Signed)
Pt left with friend driving 

## 2019-03-15 ENCOUNTER — Encounter (HOSPITAL_COMMUNITY): Payer: Self-pay | Admitting: *Deleted

## 2019-03-15 ENCOUNTER — Encounter: Payer: Self-pay | Admitting: Physician Assistant

## 2019-03-15 ENCOUNTER — Other Ambulatory Visit: Payer: Self-pay

## 2019-03-15 DIAGNOSIS — I10 Essential (primary) hypertension: Secondary | ICD-10-CM | POA: Insufficient documentation

## 2019-03-15 DIAGNOSIS — E039 Hypothyroidism, unspecified: Secondary | ICD-10-CM | POA: Insufficient documentation

## 2019-03-15 DIAGNOSIS — Z87891 Personal history of nicotine dependence: Secondary | ICD-10-CM | POA: Insufficient documentation

## 2019-03-15 DIAGNOSIS — Z91040 Latex allergy status: Secondary | ICD-10-CM | POA: Insufficient documentation

## 2019-03-15 DIAGNOSIS — M5442 Lumbago with sciatica, left side: Secondary | ICD-10-CM | POA: Insufficient documentation

## 2019-03-15 DIAGNOSIS — J45909 Unspecified asthma, uncomplicated: Secondary | ICD-10-CM | POA: Insufficient documentation

## 2019-03-15 DIAGNOSIS — Z79899 Other long term (current) drug therapy: Secondary | ICD-10-CM | POA: Insufficient documentation

## 2019-03-15 NOTE — ED Triage Notes (Signed)
Pt arrived via ems for nontramatic back pain. Does have hx of bulging discs. Seen here recently for the same

## 2019-03-15 NOTE — ED Triage Notes (Signed)
Now has numbness and tingling to left hand.  Also with hard time with standing.

## 2019-03-15 NOTE — ED Triage Notes (Signed)
Pt here via EMS for no traumatic back pain. Hx of same, here last week for the same as well.

## 2019-03-15 NOTE — ED Triage Notes (Signed)
Pt here for back pain since getting out of shower tonight. Seen here recently for back pain from a fall.

## 2019-03-15 NOTE — Progress Notes (Signed)
Release of information form has been received by mail with patient's signature and dated 03-10-19. ROI faxed to [phone] on 03-15-19.

## 2019-03-16 ENCOUNTER — Emergency Department (HOSPITAL_COMMUNITY)
Admission: EM | Admit: 2019-03-16 | Discharge: 2019-03-16 | Disposition: A | Discharge status: Home / Self Care | Payer: Medicaid Other | Attending: Emergency Medicine | Admitting: Emergency Medicine

## 2019-03-16 DIAGNOSIS — M5442 Lumbago with sciatica, left side: Secondary | ICD-10-CM

## 2019-03-16 MED ORDER — OXYCODONE-ACETAMINOPHEN 5-325 MG PO TABS: 1.0000 | ORAL_TABLET | ORAL | 0 refills | Status: DC | PRN

## 2019-03-16 MED ORDER — KETOROLAC TROMETHAMINE 30 MG/ML IJ SOLN
30.0000 mg | Freq: Once | INTRAMUSCULAR | Status: AC
  Administered 2019-03-16: 30 mg via INTRAVENOUS
  Filled 2019-03-16: qty 1

## 2019-03-16 MED ORDER — OXYCODONE-ACETAMINOPHEN 5-325 MG PO TABS: 2.0000 | ORAL_TABLET | ORAL | 0 refills | Status: DC | PRN

## 2019-03-16 MED ORDER — PREDNISONE 20 MG PO TABS: ORAL_TABLET | ORAL | 0 refills | Status: DC

## 2019-03-16 MED ORDER — MIDAZOLAM HCL 2 MG/2ML IJ SOLN
1.0000 mg | Freq: Once | INTRAMUSCULAR | Status: AC
  Administered 2019-03-16: 1 mg via INTRAVENOUS
  Filled 2019-03-16: qty 2

## 2019-03-16 NOTE — ED Provider Notes (Signed)
[facility]   CSN: [id number] Arrival date & time: 03/15/19  2207    History   Chief Complaint Chief Complaint  Patient presents with  . Back Pain    HPI Lee Jackson is a 38 y.o. male.     Patient presents to the ER for evaluation of back pain.  Patient was seen in the ER a week ago after a fall.  At that time he was experiencing low back pain.  He had an x-ray and a CT of his low back, was told that he had degenerative changes but no acute injury.  Patient reports that he was in the shower tonight, turned to the side and felt a pop in his low back followed by onset of sudden and severe pain.  Patient cannot bend or twist because of pain.  Pain is radiating to the left leg.  He is concerned because he has a history of cervical fusion.     Past Medical History:  Diagnosis Date  . Anxiety   . Asthma   . Depression   . GERD (gastroesophageal reflux disease)   . History of degenerative disc disease   . Hx of gallstones   . Hypertension   . Mood swings   . PTSD (post-traumatic stress disorder)   . Pulmonary embolism (HCC) 02/2018   bilateral  . Thyroid disease     Patient Active Problem List   Diagnosis Date Noted  . Hyperlipidemia 03/09/2019  . Back pain 03/09/2019  . Hypothyroidism 12/07/2018  . Anticoagulated 12/07/2018  . History of pulmonary embolism 12/07/2018    Past Surgical History:  Procedure Laterality Date  . ADENOIDECTOMY Bilateral   . ANKLE SURGERY Right    teenage years  . CERVICAL FUSION    . ESOPHAGOSCOPY WITH DILITATION    . HERNIA REPAIR     childhood  . ROTATOR CUFF REPAIR Right 2015  . TONSILLECTOMY          Home Medications    Prior to Admission medications   Medication Sig Start Date End Date Taking? Authorizing Provider  albuterol (PROVENTIL HFA;VENTOLIN HFA) 108 (90 Base) MCG/ACT inhaler Inhale 1-2 puffs into the lungs every 4 (four) hours as needed for wheezing or shortness of breath. 12/07/18    Jacquelin HawkingMcElroy, Shannon, PA-C  amitriptyline (ELAVIL) 75 MG tablet Take 75 mg by mouth at bedtime.    [provider]  apixaban (ELIQUIS) 5 MG TABS tablet Take 5 mg by mouth 2 (two) times daily.    [provider]  atorvastatin (LIPITOR) 20 MG tablet Take 1 tablet (20 mg total) by mouth daily. 03/09/19   Jacquelin HawkingMcElroy, Shannon, PA-C  cyclobenzaprine (FLEXERIL) 10 MG tablet Take 1 tablet (10 mg total) by mouth 3 (three) times daily. 03/10/19   Ivery QualeBryant, Hobson, PA-C  DULoxetine (CYMBALTA) 30 MG capsule Take 30 mg by mouth 2 (two) times daily.    [provider]  ibuprofen (ADVIL) 600 MG tablet Take 1 tablet (600 mg total) by mouth every 8 (eight) hours as needed. 03/09/19   Jacquelin HawkingMcElroy, Shannon, PA-C  levothyroxine (SYNTHROID, LEVOTHROID) 150 MCG tablet Take 1 tablet (150 mcg total) by mouth daily. 11/09/18   Jacquelin HawkingMcElroy, Shannon, PA-C  traMADol (ULTRAM) 50 MG tablet Take 1 tablet (50 mg total) by mouth every 6 (six) hours as needed. 03/10/19   Ivery QualeBryant, Hobson, PA-C    Family History Family History  Problem Relation Age of Onset  . Cancer Mother   . Heart attack Mother  Social History Social History   Tobacco Use  . Smoking status: Former Smoker    Packs/day: 0.50    Years: 10.00    Pack years: 5.00    Types: Cigarettes    Quit date: 09/23/2012    Years since quitting: 6.4  . Smokeless tobacco: Never Used  Substance Use Topics  . Alcohol use: Not Currently    Comment: hx etoh. none since 2014  . Drug use: Not Currently    Comment: hx OD pain meds approx 2005     Allergies   Banana, Pickled meat, Latex, Mucinex [guaifenesin er], Penicillins, Prevacid [lansoprazole], Sudafed [pseudoephedrine hcl], and Vancomycin   Review of Systems Review of Systems  Musculoskeletal: Positive for back pain.  All other systems reviewed and are negative.    Physical Exam Updated Vital Signs BP 128/90   Pulse 92   Temp 98.2 F (36.8 C) (Oral)   Resp 20   Ht 5\' 11"  (1.803 m)   Wt  113.4 kg   SpO2 96%   BMI 34.87 kg/m   Physical Exam Vitals signs and nursing note reviewed.  Constitutional:      General: He is not in acute distress.    Appearance: Normal appearance. He is well-developed.  HENT:     Head: Normocephalic and atraumatic.     Right Ear: Hearing normal.     Left Ear: Hearing normal.     Nose: Nose normal.  Eyes:     Conjunctiva/sclera: Conjunctivae normal.     Pupils: Pupils are equal, round, and reactive to light.  Neck:     Musculoskeletal: Normal range of motion and neck supple.  Cardiovascular:     Rate and Rhythm: Regular rhythm.     Heart sounds: S1 normal and S2 normal. No murmur. No friction rub. No gallop.   Pulmonary:     Effort: Pulmonary effort is normal. No respiratory distress.     Breath sounds: Normal breath sounds.  Chest:     Chest wall: No tenderness.  Abdominal:     General: Bowel sounds are normal.     Palpations: Abdomen is soft.     Tenderness: There is no abdominal tenderness. There is no guarding or rebound. Negative signs include Murphy's sign and McBurney's sign.     Hernia: No hernia is present.  Musculoskeletal: Normal range of motion.     Lumbar back: He exhibits tenderness.       Back:  Skin:    General: Skin is warm and dry.     Findings: No rash.  Neurological:     Mental Status: He is alert and oriented to person, place, and time.     GCS: GCS eye subscore is 4. GCS verbal subscore is 5. GCS motor subscore is 6.     Cranial Nerves: No cranial nerve deficit.     Sensory: No sensory deficit.     Coordination: Coordination normal.     Comments: Pain with raising left leg actively or passively past 40 degrees  Patient has normal strength flexion at hip, extension at knee, flexion extension at ankle  Patient has normal reflexes bilaterally, 3+ at patella  No saddle anesthesia  Psychiatric:        Speech: Speech normal.        Behavior: Behavior normal.        Thought Content: Thought content normal.       ED Treatments / Results  Labs (all labs ordered are listed, but only abnormal results  are displayed) Labs Reviewed - No data to display  EKG None  Radiology No results found.  Procedures Procedures (including critical care time)  Medications Ordered in ED Medications  ketorolac (TORADOL) 30 MG/ML injection 30 mg (30 mg Intravenous Given 03/16/19 0251)  midazolam (VERSED) injection 1 mg (1 mg Intravenous Given 03/16/19 0252)     Initial Impression / Assessment and Plan / ED Course  I have reviewed the triage vital signs and the nursing notes.  Pertinent labs & imaging results that were available during my care of the patient were reviewed by me and considered in my medical decision making (see chart for details).        Patient presents to the emergency department for evaluation of back pain.  Patient had sudden onset of severe pain in the left lower back with radiation to the buttock area after twisting while in the shower.  Examination seem very consistent with acute muscle spasm.  Patient did have a fall 4 days ago and had x-ray and CT scan at that time.  No acute pathology was noted although he does have chronic DJD.  He has normal strength and reflexes, normal sensation.  There is no footdrop, no saddle anesthesia.  No concern for cauda equina syndrome, low suspicion for acute rupture of disc.  Patient treated with Toradol and Versed and has had significant improvement.  Will discharge with analgesia.  Final Clinical Impressions(s) / ED Diagnoses   Final diagnoses:  Acute left-sided low back pain with left-sided sciatica    ED Discharge Orders    None       Orpah Greek, MD 03/16/19 [phone]

## 2019-03-17 MED FILL — Oxycodone w/ Acetaminophen Tab 5-325 MG: ORAL | Qty: 6 | Status: AC

## 2019-06-08 ENCOUNTER — Other Ambulatory Visit (HOSPITAL_COMMUNITY)
Admission: RE | Admit: 2019-06-08 | Discharge: 2019-06-08 | Disposition: A | Discharge status: Home / Self Care | Payer: Medicaid Other | Source: Ambulatory Visit | Attending: Physician Assistant | Admitting: Physician Assistant

## 2019-06-08 ENCOUNTER — Other Ambulatory Visit: Payer: Self-pay

## 2019-06-08 DIAGNOSIS — E039 Hypothyroidism, unspecified: Secondary | ICD-10-CM | POA: Insufficient documentation

## 2019-06-08 DIAGNOSIS — E785 Hyperlipidemia, unspecified: Secondary | ICD-10-CM | POA: Insufficient documentation

## 2019-06-08 LAB — LIPID PANEL
Cholesterol: 222 mg/dL — ABNORMAL HIGH (ref 0–200)
HDL: 38 mg/dL — ABNORMAL LOW (ref >40)
LDL Cholesterol: 152 mg/dL — ABNORMAL HIGH (ref 0–99)
Total CHOL/HDL Ratio: 5.8 RATIO
Triglycerides: 160 mg/dL — ABNORMAL HIGH (ref <150)
VLDL: 32 mg/dL (ref 0–40)

## 2019-06-08 LAB — COMPREHENSIVE METABOLIC PANEL
ALT: 32 U/L (ref 0–44)
AST: 20 U/L (ref 15–41)
Albumin: 4.2 g/dL (ref 3.5–5.0)
Alkaline Phosphatase: 50 U/L (ref 38–126)
Anion gap: 7 (ref 5–15)
BUN: 12 mg/dL (ref 6–20)
CO2: 27 mmol/L (ref 22–32)
Calcium: 8.6 mg/dL — ABNORMAL LOW (ref 8.9–10.3)
Chloride: 103 mmol/L (ref 98–111)
Creatinine, Ser: 1.07 mg/dL (ref 0.61–1.24)
GFR calc Af Amer: >60 mL/min (ref >60)
GFR calc non Af Amer: >60 mL/min (ref >60)
Glucose, Bld: 100 mg/dL — ABNORMAL HIGH (ref 70–99)
Potassium: 4.6 mmol/L (ref 3.5–5.1)
Sodium: 137 mmol/L (ref 135–145)
Total Bilirubin: 0.9 mg/dL (ref 0.3–1.2)
Total Protein: 7.4 g/dL (ref 6.5–8.1)

## 2019-06-08 LAB — TSH: TSH: 7.387 u[IU]/mL — ABNORMAL HIGH (ref 0.350–4.500)

## 2019-06-09 ENCOUNTER — Ambulatory Visit: Payer: Medicaid Other | Admitting: Physician Assistant

## 2019-06-09 ENCOUNTER — Encounter: Payer: Self-pay | Admitting: Physician Assistant

## 2019-06-09 ENCOUNTER — Other Ambulatory Visit: Payer: Self-pay | Admitting: Physician Assistant

## 2019-06-09 DIAGNOSIS — E039 Hypothyroidism, unspecified: Secondary | ICD-10-CM

## 2019-06-09 DIAGNOSIS — Z86711 Personal history of pulmonary embolism: Secondary | ICD-10-CM

## 2019-06-09 DIAGNOSIS — F39 Unspecified mood [affective] disorder: Secondary | ICD-10-CM

## 2019-06-09 DIAGNOSIS — E785 Hyperlipidemia, unspecified: Secondary | ICD-10-CM

## 2019-06-09 DIAGNOSIS — Z7901 Long term (current) use of anticoagulants: Secondary | ICD-10-CM

## 2019-06-09 NOTE — Progress Notes (Signed)
There were no vitals taken for this visit.   Subjective:    Patient ID: Lee Jackson, male    DOB: 29-May-1981, 38 y.o.   MRN: [id number]  HPI: Lee Jackson is a 38 y.o. male presenting on 06/09/2019 for Hyperlipidemia and Thyroid Problem   HPI   This is a telemedicine appointment due to coronavirus pandemic.  It is via Telephone as pt does not have a video enabled device.  I connected with  Rosalie Doctor on 06/09/19 by a video enabled telemedicine application and verified that I am speaking with the correct person using two identifiers.   I discussed the limitations of evaluation and management by telemedicine. The patient expressed understanding and agreed to proceed.  Pt is at home.  Provider is aoffice    Pt had been going to [facility] for mental health issues but he says he has not been lately due to covid.  Pt says he has been doing okay and he has no complaints today.     Relevant past medical, surgical, family and social history reviewed and updated as indicated. Interim medical history since our last visit reviewed. Allergies and medications reviewed and updated.   Current Outpatient Medications:  .  albuterol (PROVENTIL HFA;VENTOLIN HFA) 108 (90 Base) MCG/ACT inhaler, Inhale 1-2 puffs into the lungs every 4 (four) hours as needed for wheezing or shortness of breath., Disp: 3 Inhaler, Rfl: 0 .  amitriptyline (ELAVIL) 75 MG tablet, Take 75 mg by mouth at bedtime., Disp: , Rfl:  .  apixaban (ELIQUIS) 5 MG TABS tablet, Take 5 mg by mouth 2 (two) times daily., Disp: , Rfl:  .  atorvastatin (LIPITOR) 20 MG tablet, Take 1 tablet (20 mg total) by mouth daily., Disp: 90 tablet, Rfl: 1 .  DULoxetine (CYMBALTA) 30 MG capsule, Take 30 mg by mouth 2 (two) times daily., Disp: , Rfl:  .  hydrOXYzine (ATARAX/VISTARIL) 25 MG tablet, Take 25 mg by mouth every 6 (six) hours as needed., Disp: , Rfl:  .  ibuprofen (ADVIL) 600 MG tablet, Take 1 tablet (600 mg total) by mouth every 8 (eight) hours  as needed., Disp: 90 tablet, Rfl: 0 .  levothyroxine (SYNTHROID, LEVOTHROID) 150 MCG tablet, Take 1 tablet (150 mcg total) by mouth daily., Disp: 90 tablet, Rfl: 1    Review of Systems  Per HPI unless specifically indicated above     Objective:    There were no vitals taken for this visit.  Wt Readings from Last 3 Encounters:  03/15/19 250 lb (113.4 kg)  03/10/19 260 lb (117.9 kg)  11/09/18 260 lb (117.9 kg)    Physical Exam Pulmonary:     Effort: No respiratory distress.  Neurological:     Mental Status: He is alert and oriented to person, place, and time.  Psychiatric:        Attention and Perception: Attention normal.        Speech: Speech normal.        Behavior: Behavior is cooperative.     Results for orders placed or performed during the hospital encounter of 06/08/19  Lipid panel  Result Value Ref Range   Cholesterol 222 (H) 0 - 200 mg/dL   Triglycerides 160 (H) <150 mg/dL   HDL 38 (L) >40 mg/dL   Total CHOL/HDL Ratio 5.8 RATIO   VLDL 32 0 - 40 mg/dL   LDL Cholesterol 152 (H) 0 - 99 mg/dL  Comprehensive metabolic panel  Result Value Ref Range   Sodium 137 135 -  145 mmol/L   Potassium 4.6 3.5 - 5.1 mmol/L   Chloride 103 98 - 111 mmol/L   CO2 27 22 - 32 mmol/L   Glucose, Bld 100 (H) 70 - 99 mg/dL   BUN 12 6 - 20 mg/dL   Creatinine, Ser 4.091.07 0.61 - 1.24 mg/dL   Calcium 8.6 (L) 8.9 - 10.3 mg/dL   Total Protein 7.4 6.5 - 8.1 g/dL   Albumin 4.2 3.5 - 5.0 g/dL   AST 20 15 - 41 U/L   ALT 32 0 - 44 U/L   Alkaline Phosphatase 50 38 - 126 U/L   Total Bilirubin 0.9 0.3 - 1.2 mg/dL   GFR calc non Af Amer >60 >60 mL/min   GFR calc Af Amer >60 >60 mL/min   Anion gap 7 5 - 15  TSH  Result Value Ref Range   TSH 7.387 (H) 0.350 - 4.500 uIU/mL      Assessment & Plan:     Encounter Diagnoses  Name Primary?  . Hyperlipidemia, unspecified hyperlipidemia type Yes  . Hypothyroidism, unspecified type   . Mood disorder (HCC)   . Anticoagulated   . History of  pulmonary embolism        -reviewed labs with pt.  He says he is taking his lipitor and is ready for a refill which means he has been on it for almost 3 months.  Discussed with pt that his lipids are still elevated.  He is to continue the lipitor and lowfat diet and is encouraged to exercise regularly.  It his lipids are still elevated at next check, his dosage will be increased.  -pt counseled to Increase water intake as he is only drinking about 30 oz daily -pt to Continue current medications -pt is counseled to contact [facility] to get back in to be seen again -pt to follow up in 3 months.  He is to contact office sooner prn

## 2019-07-23 ENCOUNTER — Other Ambulatory Visit: Payer: Self-pay

## 2019-07-23 DIAGNOSIS — Z20822 Contact with and (suspected) exposure to covid-19: Secondary | ICD-10-CM

## 2019-07-25 LAB — NOVEL CORONAVIRUS, NAA: SARS-CoV-2, NAA: NOT DETECTED

## 2019-08-02 ENCOUNTER — Encounter (HOSPITAL_COMMUNITY): Payer: Self-pay | Admitting: *Deleted

## 2019-08-02 ENCOUNTER — Emergency Department (HOSPITAL_COMMUNITY)
Admission: EM | Admit: 2019-08-02 | Discharge: 2019-08-02 | Disposition: A | Discharge status: Home / Self Care | Payer: Medicaid Other | Attending: Emergency Medicine | Admitting: Emergency Medicine

## 2019-08-02 ENCOUNTER — Other Ambulatory Visit: Payer: Self-pay

## 2019-08-02 DIAGNOSIS — J45909 Unspecified asthma, uncomplicated: Secondary | ICD-10-CM | POA: Insufficient documentation

## 2019-08-02 DIAGNOSIS — Z91040 Latex allergy status: Secondary | ICD-10-CM | POA: Insufficient documentation

## 2019-08-02 DIAGNOSIS — Z7901 Long term (current) use of anticoagulants: Secondary | ICD-10-CM | POA: Insufficient documentation

## 2019-08-02 DIAGNOSIS — K222 Esophageal obstruction: Secondary | ICD-10-CM | POA: Insufficient documentation

## 2019-08-02 DIAGNOSIS — T18128A Food in esophagus causing other injury, initial encounter: Secondary | ICD-10-CM | POA: Insufficient documentation

## 2019-08-02 DIAGNOSIS — Y9389 Activity, other specified: Secondary | ICD-10-CM | POA: Insufficient documentation

## 2019-08-02 DIAGNOSIS — Y929 Unspecified place or not applicable: Secondary | ICD-10-CM | POA: Insufficient documentation

## 2019-08-02 DIAGNOSIS — Y999 Unspecified external cause status: Secondary | ICD-10-CM | POA: Insufficient documentation

## 2019-08-02 DIAGNOSIS — Z87891 Personal history of nicotine dependence: Secondary | ICD-10-CM | POA: Insufficient documentation

## 2019-08-02 DIAGNOSIS — E039 Hypothyroidism, unspecified: Secondary | ICD-10-CM | POA: Insufficient documentation

## 2019-08-02 DIAGNOSIS — I10 Essential (primary) hypertension: Secondary | ICD-10-CM | POA: Insufficient documentation

## 2019-08-02 DIAGNOSIS — Z20828 Contact with and (suspected) exposure to other viral communicable diseases: Secondary | ICD-10-CM | POA: Insufficient documentation

## 2019-08-02 DIAGNOSIS — X58XXXA Exposure to other specified factors, initial encounter: Secondary | ICD-10-CM | POA: Insufficient documentation

## 2019-08-02 DIAGNOSIS — Z79899 Other long term (current) drug therapy: Secondary | ICD-10-CM | POA: Insufficient documentation

## 2019-08-02 LAB — CBC WITH DIFFERENTIAL/PLATELET
Abs Immature Granulocytes: 0.04 10*3/uL (ref 0.00–0.07)
Basophils Absolute: 0.1 10*3/uL (ref 0.0–0.1)
Basophils Relative: 1 %
Eosinophils Absolute: 0.3 10*3/uL (ref 0.0–0.5)
Eosinophils Relative: 3 %
HCT: 49 % (ref 39.0–52.0)
Hemoglobin: 16.1 g/dL (ref 13.0–17.0)
Immature Granulocytes: 0 %
Lymphocytes Relative: 27 %
Lymphs Abs: 2.7 10*3/uL (ref 0.7–4.0)
MCH: 27.2 pg (ref 26.0–34.0)
MCHC: 32.9 g/dL (ref 30.0–36.0)
MCV: 82.9 fL (ref 80.0–100.0)
Monocytes Absolute: 0.8 10*3/uL (ref 0.1–1.0)
Monocytes Relative: 8 %
Neutro Abs: 6.2 10*3/uL (ref 1.7–7.7)
Neutrophils Relative %: 61 %
Platelets: 248 10*3/uL (ref 150–400)
RBC: 5.91 MIL/uL — ABNORMAL HIGH (ref 4.22–5.81)
RDW: 12.3 % (ref 11.5–15.5)
WBC: 10.1 10*3/uL (ref 4.0–10.5)
nRBC: 0 % (ref 0.0–0.2)

## 2019-08-02 LAB — BASIC METABOLIC PANEL
Anion gap: 6 (ref 5–15)
BUN: 14 mg/dL (ref 6–20)
CO2: 24 mmol/L (ref 22–32)
Calcium: 8.6 mg/dL — ABNORMAL LOW (ref 8.9–10.3)
Chloride: 108 mmol/L (ref 98–111)
Creatinine, Ser: 1.12 mg/dL (ref 0.61–1.24)
GFR calc Af Amer: >60 mL/min (ref >60)
GFR calc non Af Amer: >60 mL/min (ref >60)
Glucose, Bld: 103 mg/dL — ABNORMAL HIGH (ref 70–99)
Potassium: 3.8 mmol/L (ref 3.5–5.1)
Sodium: 138 mmol/L (ref 135–145)

## 2019-08-02 LAB — SARS CORONAVIRUS 2 BY RT PCR (HOSPITAL ORDER, PERFORMED IN ~~LOC~~ HOSPITAL LAB): SARS Coronavirus 2: NEGATIVE

## 2019-08-02 NOTE — Discharge Instructions (Addendum)
You can call Lee Jackson to help you get an appointment to be evaluated by the gastroenterologist on-call, Dr. Gala Romney.  You need to eat a soft diet until you can see him and arrange to have endoscopy to see if you need to have your esophagus stretched again.  If you eat foods that are dry or or bulky, you will have the obstruction happen again.  Recheck if you get a blockage again.

## 2019-08-02 NOTE — ED Triage Notes (Signed)
Pt states that he was eating chicken tonight and felt it get stuck in his throat, tried drinking water without success, reports that the water came back up, has had problems in the past and had to have his esophagus stretched, pt spitting salvia in bag upon ems arrival to er

## 2019-08-02 NOTE — ED Notes (Signed)
Pt stated to nurse that he thinks part of the chicken has went down but his throat still feels "weird".

## 2019-08-02 NOTE — ED Provider Notes (Signed)
[facility] EMERGENCY DEPARTMENT Provider Note   CSN: [id number] Arrival date & time: 08/02/19  0014   Time seen 12:33 AM  History   Chief Complaint Chief Complaint  Patient presents with  . Choking    HPI Lee Jackson is a 38 y.o. male.     HPI patient states about 7 or 8 years ago he was eating steak and it got stuck and he had to have his esophagus stretched while living in [facility].  He states the last few weeks he has been noticing when he eats solids that it is hard to go down and sometimes he has to drink water behind it.  He states tonight he was eating [place] fried chicken and he ate a piece of skin and felt like it got stuck.  He points to his mid throat area and states it stuck there.  He has been trying to drink water to get it to go down however he states the water is coming up.  Patient is spitting into a emesis bag.    Patient is on Eliquis for a PE 2 years ago after he had cervical spine fusion.  PCP Soyla Dryer, PA-C   Past Medical History:  Diagnosis Date  . Anxiety   . Asthma   . Depression   . GERD (gastroesophageal reflux disease)   . History of degenerative disc disease   . Hx of gallstones   . Hypertension   . Mood swings   . PTSD (post-traumatic stress disorder)   . Pulmonary embolism ([place]) 02/2018   bilateral  . Thyroid disease     Patient Active Problem List   Diagnosis Date Noted  . Hyperlipidemia 03/09/2019  . Back pain 03/09/2019  . Hypothyroidism 12/07/2018  . Anticoagulated 12/07/2018  . History of pulmonary embolism 12/07/2018    Past Surgical History:  Procedure Laterality Date  . ADENOIDECTOMY Bilateral   . ANKLE SURGERY Right    teenage years  . CERVICAL FUSION    . ESOPHAGOSCOPY WITH DILITATION    . HERNIA REPAIR     childhood  . ROTATOR CUFF REPAIR Right 2015  . TONSILLECTOMY          Home Medications    Prior to Admission medications   Medication Sig Start Date End Date Taking?  Authorizing Provider  amitriptyline (ELAVIL) 75 MG tablet Take 75 mg by mouth at bedtime.    [provider]  apixaban (ELIQUIS) 5 MG TABS tablet Take 5 mg by mouth 2 (two) times daily.    [provider]  atorvastatin (LIPITOR) 20 MG tablet Take 1 tablet (20 mg total) by mouth daily. 03/09/19   Soyla Dryer, PA-C  DULoxetine (CYMBALTA) 30 MG capsule Take 30 mg by mouth 2 (two) times daily.    [provider]  hydrOXYzine (ATARAX/VISTARIL) 25 MG tablet Take 25 mg by mouth every 6 (six) hours as needed.    [provider]  ibuprofen (ADVIL) 600 MG tablet TAKE 1 Tablet  BY MOUTH EVERY 8 HOURS AS NEEDED 06/09/19   Soyla Dryer, PA-C  levothyroxine (SYNTHROID, LEVOTHROID) 150 MCG tablet Take 1 tablet (150 mcg total) by mouth daily. 11/09/18   Soyla Dryer, PA-C  PROVENTIL HFA 108 (90 Base) MCG/ACT inhaler INHALE 1 TO 2 PUFFS BY MOUTH EVERY FOUR HOURS AS NEEDED FOR WHEEZING OR SHORTNESS OF BREATH 06/09/19   Soyla Dryer, PA-C    Family History Family History  Problem Relation Age of Onset  . Cancer Mother   .  Heart attack Mother     Social History Social History   Tobacco Use  . Smoking status: Former Smoker    Packs/day: 0.50    Years: 10.00    Pack years: 5.00    Types: Cigarettes    Quit date: 09/23/2012    Years since quitting: 6.8  . Smokeless tobacco: Never Used  Substance Use Topics  . Alcohol use: Not Currently    Comment: hx etoh. none since 2014  . Drug use: Not Currently    Comment: hx OD pain meds approx 2005     Allergies   Banana, Pickled meat, Latex, Mucinex [guaifenesin er], Penicillins, Prevacid [lansoprazole], Sudafed [pseudoephedrine hcl], and Vancomycin   Review of Systems Review of Systems  All other systems reviewed and are negative.    Physical Exam Updated Vital Signs BP (!) 147/95   Pulse (!) 101   Temp 98.3 F (36.8 C) (Oral)   Resp 16   Ht 5\' 10"  (1.778 m)   Wt 113.4 kg   SpO2 98%   BMI 35.87  kg/m   Physical Exam Vitals signs and nursing note reviewed.  Constitutional:      General: He is in acute distress.     Appearance: Normal appearance.  HENT:     Head: Normocephalic and atraumatic.     Right Ear: External ear normal.     Left Ear: External ear normal.     Mouth/Throat:     Pharynx: Oropharynx is clear.  Eyes:     Extraocular Movements: Extraocular movements intact.     Conjunctiva/sclera: Conjunctivae normal.     Pupils: Pupils are equal, round, and reactive to light.  Neck:     Musculoskeletal: Normal range of motion.  Cardiovascular:     Rate and Rhythm: Normal rate and regular rhythm.  Pulmonary:     Effort: Pulmonary effort is normal. No respiratory distress.  Musculoskeletal: Normal range of motion.  Skin:    General: Skin is warm and dry.  Neurological:     General: No focal deficit present.     Mental Status: He is alert and oriented to person, place, and time.     Cranial Nerves: No cranial nerve deficit.  Psychiatric:        Mood and Affect: Mood normal.        Behavior: Behavior normal.        Thought Content: Thought content normal.      ED Treatments / Results  Labs (all labs ordered are listed, but only abnormal results are displayed)  Results for orders placed or performed during the hospital encounter of 08/02/19  SARS Coronavirus 2 by RT PCR (hospital order, [facility]) Nasopharyngeal Nasopharyngeal Swab   Specimen: Nasopharyngeal Swab  Result Value Ref Range   SARS Coronavirus 2 NEGATIVE NEGATIVE  CBC with Differential  Result Value Ref Range   WBC 10.1 4.0 - 10.5 K/uL   RBC 5.91 (H) 4.22 - 5.81 MIL/uL   Hemoglobin 16.1 13.0 - 17.0 g/dL   HCT 53.649.0 64.439.0 - 03.452.0 %   MCV 82.9 80.0 - 100.0 fL   MCH 27.2 26.0 - 34.0 pg   MCHC 32.9 30.0 - 36.0 g/dL   RDW 74.212.3 59.511.5 - 63.815.5 %   Platelets 248 150 - 400 K/uL   nRBC 0.0 0.0 - 0.2 %   Neutrophils Relative % 61 %   Neutro Abs 6.2 1.7 - 7.7 K/uL   Lymphocytes  Relative 27 %  Lymphs Abs 2.7 0.7 - 4.0 K/uL   Monocytes Relative 8 %   Monocytes Absolute 0.8 0.1 - 1.0 K/uL   Eosinophils Relative 3 %   Eosinophils Absolute 0.3 0.0 - 0.5 K/uL   Basophils Relative 1 %   Basophils Absolute 0.1 0.0 - 0.1 K/uL   Immature Granulocytes 0 %   Abs Immature Granulocytes 0.04 0.00 - 0.07 K/uL  Basic metabolic panel  Result Value Ref Range   Sodium 138 135 - 145 mmol/L   Potassium 3.8 3.5 - 5.1 mmol/L   Chloride 108 98 - 111 mmol/L   CO2 24 22 - 32 mmol/L   Glucose, Bld 103 (H) 70 - 99 mg/dL   BUN 14 6 - 20 mg/dL   Creatinine, Ser 3.41 0.61 - 1.24 mg/dL   Calcium 8.6 (L) 8.9 - 10.3 mg/dL   GFR calc non Af Amer >60 >60 mL/min   GFR calc Af Amer >60 >60 mL/min   Anion gap 6 5 - 15   Laboratory interpretation all normal    Results for orders placed or performed in visit on 07/23/19  Novel Coronavirus, NAA ([facility])   Specimen: Nasopharyngeal(NP) swabs in vial transport medium   NASOPHARYNGE  TESTING  Result Value Ref Range   SARS-CoV-2, NAA Not Detected Not Detected        EKG None  Radiology No results found.  Procedures Procedures (including critical care time)  Medications Ordered in ED Medications - No data to display   Initial Impression / Assessment and Plan / ED Course  I have reviewed the triage vital signs and the nursing notes.  Pertinent labs & imaging results that were available during my care of the patient were reviewed by me and considered in my medical decision making (see chart for details).       12:58 AM patient was discussed with Dr. Jena Gauss, gastroenterologist  1:07 AM Dr. Jena Gauss called back, he has talked to the house supervisor about getting the rapid Covid test which will take about 2 hours to result.  He states that he anticipates coming in early around 6:30 in the morning to get the patient taken care of.  3:30 AM patient reports he passed the blockage about an hour ago, he is now able to drink without  difficulty.  He was discharged home to follow-up with Dr. Benard Rink in the office to arrange endoscopy.  Final Clinical Impressions(s) / ED Diagnoses   Final diagnoses:  Esophageal obstruction due to food impaction    Disposition pending  Devoria Albe, MD, Concha Pyo, MD 08/02/19 [phone]

## 2019-09-07 ENCOUNTER — Ambulatory Visit: Payer: Medicaid Other | Admitting: Physician Assistant

## 2020-01-12 IMAGING — CT CT LUMBAR SPINE WITHOUT CONTRAST
3 of 4 series · 11 of 33 positions shown, 13 images · non-contrast
Comparison: Lumbar radiographs 03/10/2019

CLINICAL DATA: Fall.  Back pain

EXAM:
CT LUMBAR SPINE WITHOUT CONTRAST
TECHNIQUE: Multidetector CT imaging of the lumbar spine was performed without
intravenous contrast administration. Multiplanar CT image
reconstructions were also generated.

[Series 4: sagittal bone · sagittal · 0.27mm/px · 5 of 76 slices shown, 6 images]
[im 26/76  bone]
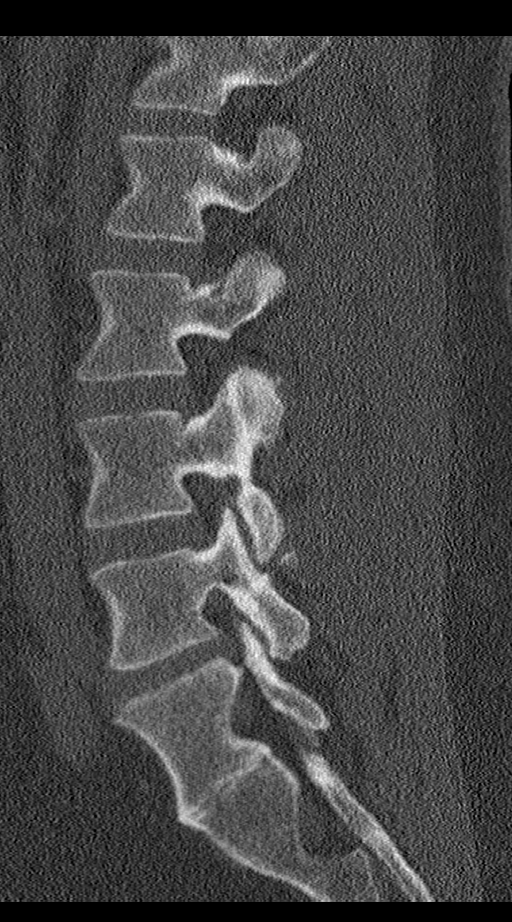
[im 32/76  bone]
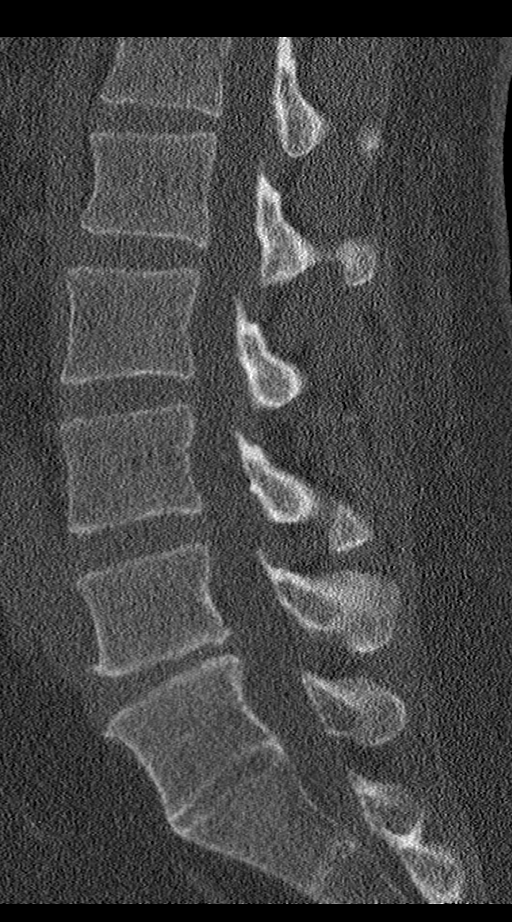
[im 38/76  soft-tissue]
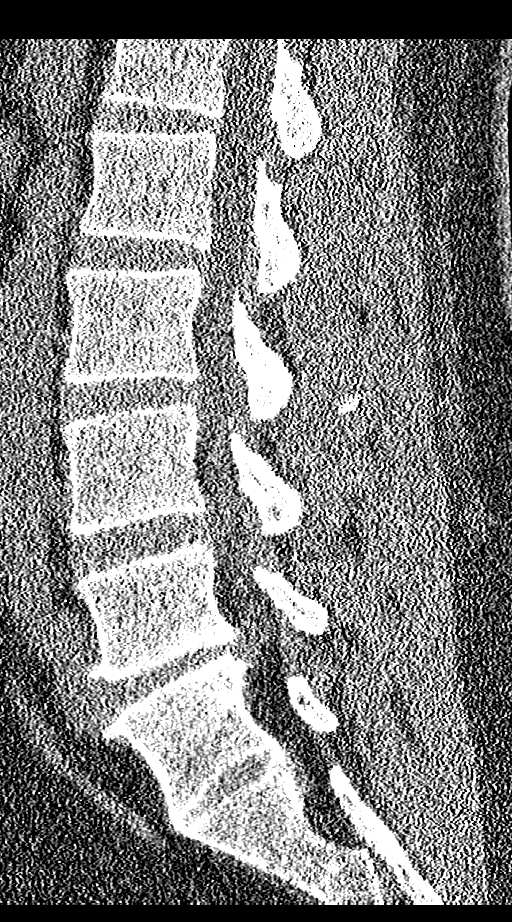
[im 38/76  bone]
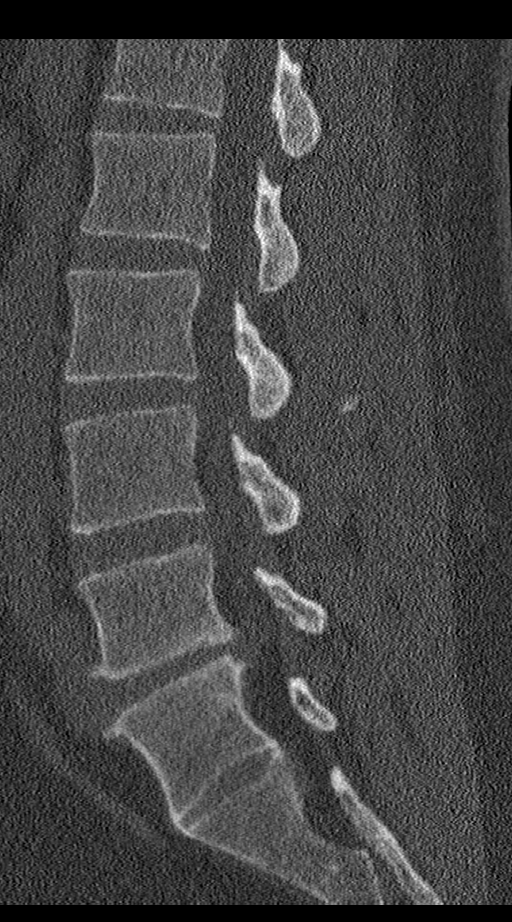
[im 44/76  bone]
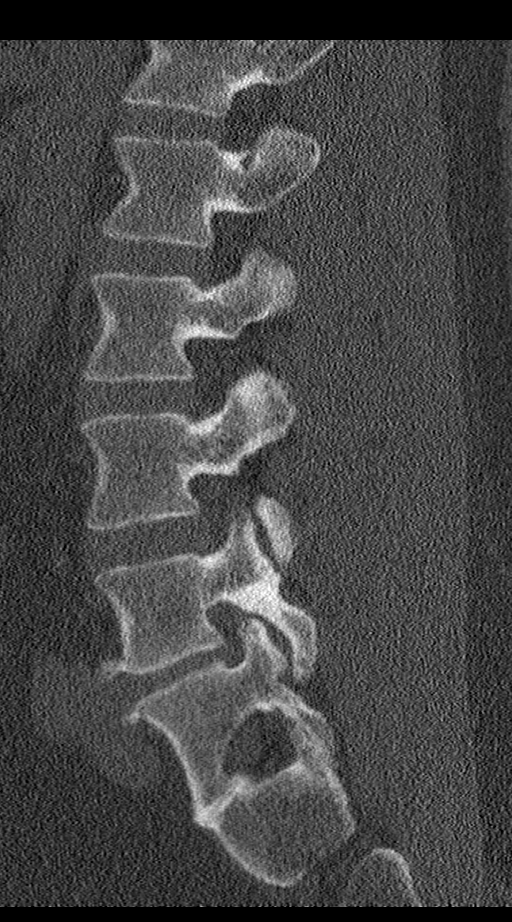
[im 51/76  bone]
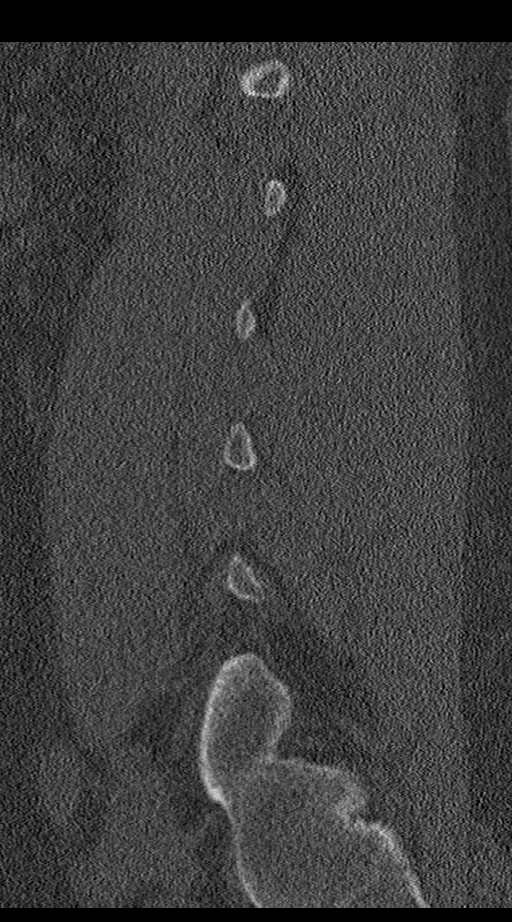

[Series 5: coronal bone · coronal · 0.26mm/px · 3 of 61 slices shown]
[im 13/61  bone]
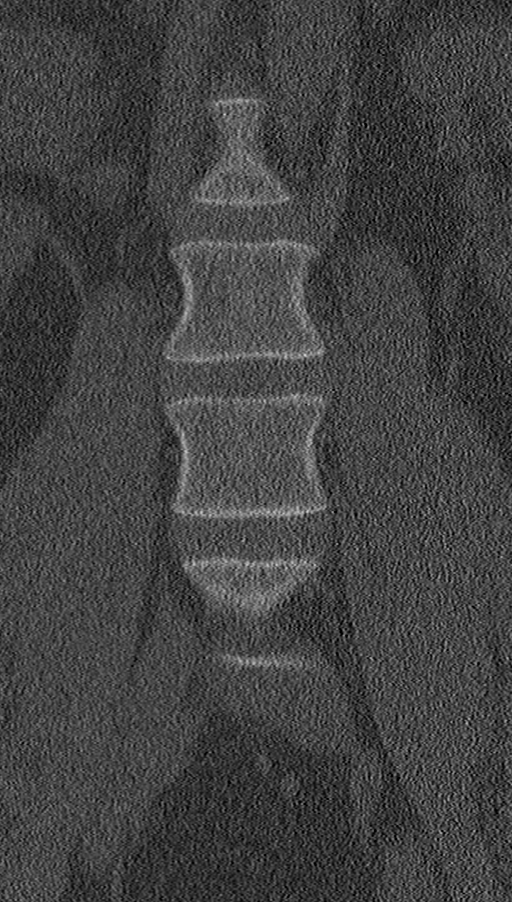
[im 25/61  bone]
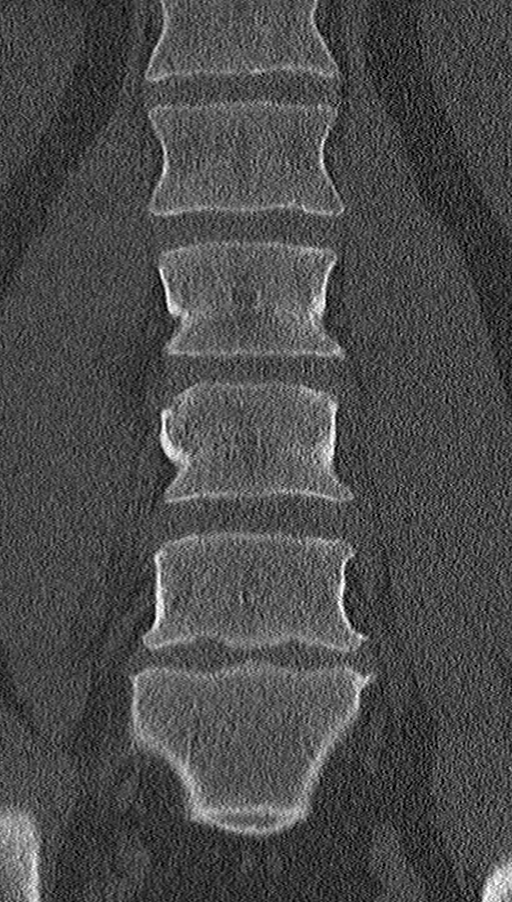
[im 37/61  bone]
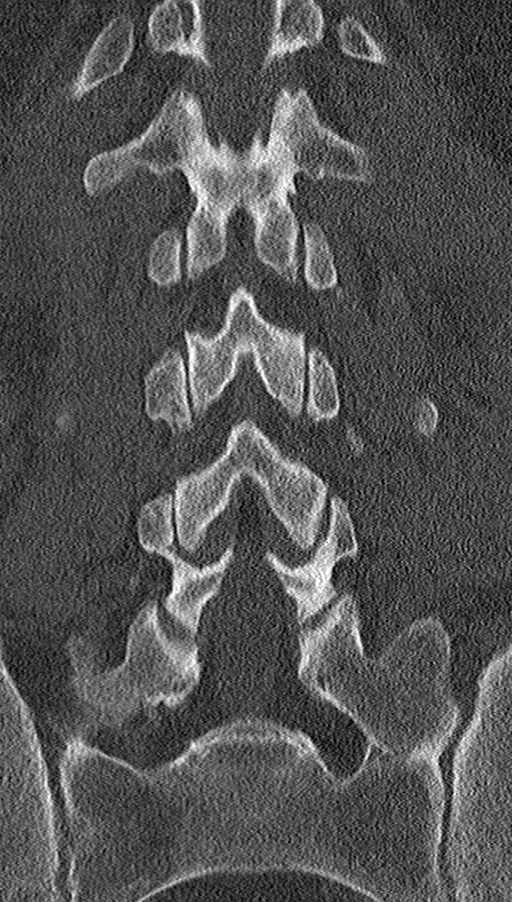

[Series 7: ax disc · axial · 0.21mm/px · z∈[-336,-179]mm · 3 of 106 slices shown, 4 images]
[im 18/106  soft-tissue]
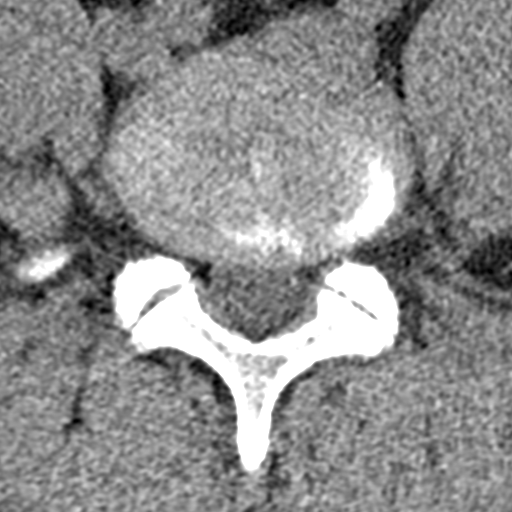
[im 18/106  bone]
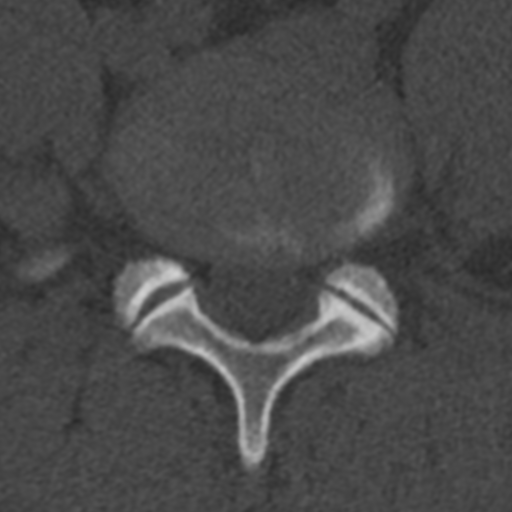
[im 53/106  bone]
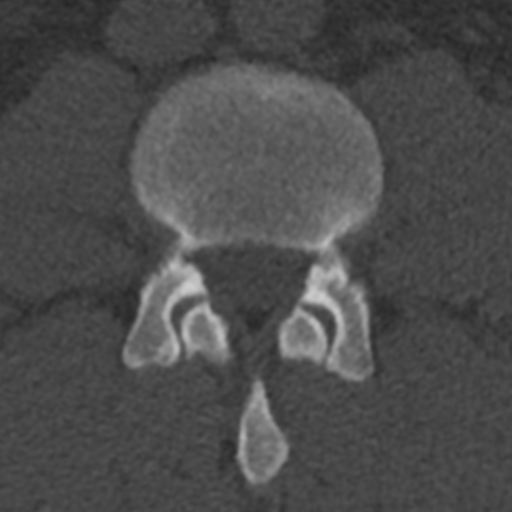
[im 88/106  bone]
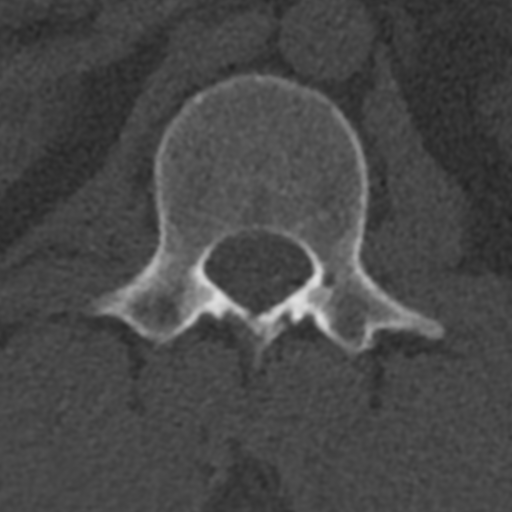

[11 of 33 positions shown; findings below may reference images not displayed]

FINDINGS: Segmentation: Incomplete segmentation of L5 which is partially
incorporated into the sacrum. Otherwise normal segmentation.

Alignment: Normal

Vertebrae: Negative for fracture or mass.

Paraspinal and other soft tissues: Negative

Disc levels: L1-2: Negative

L2-3: Mild disc and mild facet degeneration.  Negative for stenosis.

L3-4: Mild disc and mild facet degeneration.  Negative for stenosis.

L4-5: Moderate disc degeneration with disc space narrowing. Disc
bulging and endplate spurring. Mild facet degeneration. Mild
subarticular stenosis on the left.

L5-S1: Incomplete disc space without stenosis.
IMPRESSION: Negative for lumbar fracture.  No acute abnormality

Lumbar disc and facet degeneration as above.
